# Patient Record
Sex: Male | Born: 1950 | Race: White | Hispanic: No | Marital: Single | Smoking: Never smoker
Health system: Southern US, Community
[De-identification: ages and names within clinical notes are randomized; demographics above are authoritative.]

## PROBLEM LIST (undated history)

## (undated) DIAGNOSIS — N200 Calculus of kidney: Secondary | ICD-10-CM

## (undated) DIAGNOSIS — D649 Anemia, unspecified: Secondary | ICD-10-CM

## (undated) DIAGNOSIS — E785 Hyperlipidemia, unspecified: Secondary | ICD-10-CM

## (undated) DIAGNOSIS — E559 Vitamin D deficiency, unspecified: Secondary | ICD-10-CM

## (undated) DIAGNOSIS — D126 Benign neoplasm of colon, unspecified: Secondary | ICD-10-CM

## (undated) DIAGNOSIS — I10 Essential (primary) hypertension: Secondary | ICD-10-CM

## (undated) HISTORY — DX: Vitamin D deficiency, unspecified: E55.9

## (undated) HISTORY — DX: Calculus of kidney: N20.0

## (undated) HISTORY — DX: Anemia, unspecified: D64.9

## (undated) HISTORY — PX: COLONOSCOPY W/ POLYPECTOMY: SHX1380

## (undated) HISTORY — PX: POLYPECTOMY: SHX149

## (undated) HISTORY — PX: COLONOSCOPY: SHX174

## (undated) HISTORY — PX: OTHER SURGICAL HISTORY: SHX169

## (undated) HISTORY — PX: TONSILLECTOMY: SHX5217

## (undated) HISTORY — DX: Benign neoplasm of colon, unspecified: D12.6

---

## 1997-07-04 ENCOUNTER — Ambulatory Visit (HOSPITAL_COMMUNITY): Admission: RE | Admit: 1997-07-04 | Discharge: 1997-07-04 | Payer: Self-pay | Admitting: Family Medicine

## 2000-01-20 ENCOUNTER — Emergency Department (HOSPITAL_COMMUNITY): Admission: EM | Admit: 2000-01-20 | Discharge: 2000-01-20 | Payer: Self-pay | Admitting: Emergency Medicine

## 2008-10-03 ENCOUNTER — Ambulatory Visit: Payer: Self-pay | Admitting: Gastroenterology

## 2008-10-22 ENCOUNTER — Encounter: Payer: Self-pay | Admitting: Gastroenterology

## 2008-10-22 ENCOUNTER — Telehealth: Payer: Self-pay | Admitting: Gastroenterology

## 2008-10-24 ENCOUNTER — Ambulatory Visit: Payer: Self-pay | Admitting: Gastroenterology

## 2008-10-24 ENCOUNTER — Encounter: Payer: Self-pay | Admitting: Gastroenterology

## 2008-10-25 ENCOUNTER — Encounter: Payer: Self-pay | Admitting: Gastroenterology

## 2011-07-26 ENCOUNTER — Encounter (HOSPITAL_COMMUNITY): Payer: Self-pay | Admitting: *Deleted

## 2011-07-26 ENCOUNTER — Emergency Department (HOSPITAL_COMMUNITY)
Admission: EM | Admit: 2011-07-26 | Discharge: 2011-07-26 | Disposition: A | Discharge status: Home / Self Care | Payer: PRIVATE HEALTH INSURANCE | Attending: Emergency Medicine | Admitting: Emergency Medicine

## 2011-07-26 ENCOUNTER — Emergency Department (HOSPITAL_COMMUNITY): Payer: PRIVATE HEALTH INSURANCE

## 2011-07-26 DIAGNOSIS — R112 Nausea with vomiting, unspecified: Secondary | ICD-10-CM | POA: Insufficient documentation

## 2011-07-26 DIAGNOSIS — R109 Unspecified abdominal pain: Secondary | ICD-10-CM | POA: Insufficient documentation

## 2011-07-26 DIAGNOSIS — I10 Essential (primary) hypertension: Secondary | ICD-10-CM | POA: Insufficient documentation

## 2011-07-26 DIAGNOSIS — E785 Hyperlipidemia, unspecified: Secondary | ICD-10-CM | POA: Insufficient documentation

## 2011-07-26 DIAGNOSIS — Z79899 Other long term (current) drug therapy: Secondary | ICD-10-CM | POA: Insufficient documentation

## 2011-07-26 DIAGNOSIS — Z7982 Long term (current) use of aspirin: Secondary | ICD-10-CM | POA: Insufficient documentation

## 2011-07-26 DIAGNOSIS — N201 Calculus of ureter: Secondary | ICD-10-CM | POA: Insufficient documentation

## 2011-07-26 HISTORY — DX: Hyperlipidemia, unspecified: E78.5

## 2011-07-26 HISTORY — DX: Essential (primary) hypertension: I10

## 2011-07-26 LAB — URINALYSIS, ROUTINE W REFLEX MICROSCOPIC
Nitrite: NEGATIVE
Specific Gravity, Urine: 1.025 (ref 1.005–1.030)
Urobilinogen, UA: 0.2 mg/dL (ref 0.0–1.0)

## 2011-07-26 LAB — COMPREHENSIVE METABOLIC PANEL
AST: 22 U/L (ref 0–37)
Albumin: 4.1 g/dL (ref 3.5–5.2)
BUN: 20 mg/dL (ref 6–23)
Chloride: 107 mEq/L (ref 96–112)
Creatinine, Ser: 1.35 mg/dL (ref 0.50–1.35)
Potassium: 3.5 mEq/L (ref 3.5–5.1)
Total Bilirubin: 0.6 mg/dL (ref 0.3–1.2)
Total Protein: 7 g/dL (ref 6.0–8.3)

## 2011-07-26 LAB — CBC
MCHC: 33.6 g/dL (ref 30.0–36.0)
MCV: 88.3 fL (ref 78.0–100.0)
Platelets: 155 10*3/uL (ref 150–400)
RDW: 13.5 % (ref 11.5–15.5)
WBC: 8.8 10*3/uL (ref 4.0–10.5)

## 2011-07-26 LAB — URINE MICROSCOPIC-ADD ON

## 2011-07-26 LAB — LIPASE, BLOOD: Lipase: 21 U/L (ref 11–59)

## 2011-07-26 MED ORDER — MORPHINE SULFATE 4 MG/ML IJ SOLN
4.0000 mg | Freq: Once | INTRAMUSCULAR | Status: AC
  Administered 2011-07-26: 4 mg via INTRAVENOUS
  Filled 2011-07-26: qty 1

## 2011-07-26 MED ORDER — OXYCODONE-ACETAMINOPHEN 5-325 MG PO TABS: 2.0000 | ORAL_TABLET | ORAL | Status: AC | PRN

## 2011-07-26 MED ORDER — IOHEXOL 300 MG/ML  SOLN
100.0000 mL | Freq: Once | INTRAMUSCULAR | Status: AC | PRN
  Administered 2011-07-26: 100 mL via INTRAVENOUS

## 2011-07-26 MED ORDER — ONDANSETRON HCL 4 MG/2ML IJ SOLN
4.0000 mg | Freq: Once | INTRAMUSCULAR | Status: AC
  Administered 2011-07-26: 4 mg via INTRAVENOUS
  Filled 2011-07-26: qty 2

## 2011-07-26 MED ORDER — SODIUM CHLORIDE 0.9 % IV SOLN
INTRAVENOUS | Status: DC
  Administered 2011-07-26: 05:00:00 via INTRAVENOUS

## 2011-07-26 MED ORDER — PROMETHAZINE HCL 25 MG PO TABS: 25.0000 mg | ORAL_TABLET | Freq: Four times a day (QID) | ORAL | Status: DC | PRN

## 2011-07-26 MED ORDER — HYDROMORPHONE HCL PF 1 MG/ML IJ SOLN
1.0000 mg | Freq: Once | INTRAMUSCULAR | Status: AC
  Administered 2011-07-26: 1 mg via INTRAVENOUS
  Filled 2011-07-26: qty 1

## 2011-07-26 NOTE — ED Provider Notes (Signed)
History     CSN: [id number]  Arrival date & time 07/26/11  0422   First MD Initiated Contact with Patient 07/26/11 [phone]      Chief Complaint  Patient presents with  . Abdominal Pain  . Nausea  . Emesis    (Consider location/radiation/quality/duration/timing/severity/associated sxs/prior treatment) HPI History provided by the patient. Left-sided abdominal pain onset last night. Has associated nausea vomiting. No fevers. No hematuria. No trauma. No radiation of pain. Quality dull. No history of similar symptoms. No known aggravating or alleviating factors.  Past Medical History  Diagnosis Date  . Hypertension   . Hyperlipidemia     No past surgical history on file.  History reviewed. No pertinent family history.  History  Substance Use Topics  . Smoking status: Not on file  . Smokeless tobacco: Not on file  . Alcohol Use: No      Review of Systems  Constitutional: Negative for fever and chills.  HENT: Negative for neck pain and neck stiffness.   Eyes: Negative for pain.  Respiratory: Negative for shortness of breath.   Cardiovascular: Negative for chest pain.  Gastrointestinal: Positive for nausea, vomiting and abdominal pain.  Genitourinary: Positive for flank pain. Negative for dysuria and hematuria.  Musculoskeletal: Negative for back pain.  Skin: Negative for rash.  Neurological: Negative for headaches.  All other systems reviewed and are negative.    Allergies  Review of patient's allergies indicates no known allergies.  Home Medications   Current Outpatient Rx  Name Route Sig Dispense Refill  . AMLODIPINE BESYLATE 10 MG PO TABS Oral Take 10 mg by mouth daily.    . ASPIRIN 81 MG PO CHEW Oral Chew 81 mg by mouth daily.    Marland Kitchen LISINOPRIL 20 MG PO TABS Oral Take 20 mg by mouth daily.    Marland Kitchen SIMVASTATIN 40 MG PO TABS Oral Take 40 mg by mouth every evening.    Marland Kitchen VITAMIN B-1 100 MG PO TABS Oral Take 100 mg by mouth daily.      BP 157/81  Pulse 78   Temp(Src) 98.3 F (36.8 C) (Oral)  Resp 16  SpO2 95%  Physical Exam  Constitutional: He is oriented to person, place, and time. He appears well-developed and well-nourished.  HENT:  Head: Normocephalic and atraumatic.  Eyes: Conjunctivae and EOM are normal. Pupils are equal, round, and reactive to light.  Neck: Trachea normal. Neck supple. No thyromegaly present.  Cardiovascular: Normal rate, regular rhythm, S1 normal, S2 normal and normal pulses.     No systolic murmur is present   No diastolic murmur is present  Pulses:      Radial pulses are 2+ on the right side, and 2+ on the left side.  Pulmonary/Chest: Effort normal and breath sounds normal. He has no wheezes. He has no rhonchi. He has no rales. He exhibits no tenderness.  Abdominal: Soft. Normal appearance and bowel sounds are normal. There is no rebound, no guarding, no CVA tenderness and negative Murphy's sign.       Tender left lower quadrant  Musculoskeletal:       BLE:s Calves nontender, no cords or erythema, negative Homans sign  Neurological: He is alert and oriented to person, place, and time. He has normal strength. No cranial nerve deficit or sensory deficit. GCS eye subscore is 4. GCS verbal subscore is 5. GCS motor subscore is 6.  Skin: Skin is warm and dry. No rash noted. He is not diaphoretic.  Psychiatric: His speech is normal.  Cooperative and appropriate    ED Course  Procedures (including critical care time)  Labs Reviewed  COMPREHENSIVE METABOLIC PANEL - Abnormal; Notable for the following:    Glucose, Bld 130 (*)    GFR calc non Af Amer 55 (*)    GFR calc Af Amer 64 (*)    All other components within normal limits  URINALYSIS, ROUTINE W REFLEX MICROSCOPIC - Abnormal; Notable for the following:    Hgb urine dipstick SMALL (*)    Ketones, ur 40 (*)    All other components within normal limits  CBC  LIPASE, BLOOD  URINE MICROSCOPIC-ADD ON   Ct Abdomen Pelvis W Contrast  07/26/2011  *RADIOLOGY  REPORT*  Clinical Data: Left lower quadrant abdominal pain with nausea and vomiting.  CT ABDOMEN AND PELVIS WITH CONTRAST  Technique:  Multidetector CT imaging of the abdomen and pelvis was performed following the standard protocol during bolus administration of intravenous contrast.  Contrast: OMNIPAQUE IOHEXOL 300 MG/ML  SOLN  Comparison: None.  Findings: There is a 4 mm calculus located either at the left ureterovesicle junction or just into the lumen of the bladder. Associated left-sided hydroureter and hydronephrosis present as well as evidence of forniceal rupture with perinephric fluid present.  Additional calculus in the mid collecting system of the left kidney measures 10 mm in height.  No right-sided renal calculi or obstruction.  The liver, gallbladder, pancreas, spleen, adrenal glands and bowel are within normal limits.  No hernias.  No abscess.  The prostate gland shows moderate enlargement.  No enlarged lymph nodes or incidental masses.  Bony structures show mild degenerative changes of the spine.  IMPRESSION: Left-sided renal obstruction with hydronephrosis, hydroureter and evidence of forniceal decompression due to a 4 mm calculus located either at the left UVJ or just into the lumen of the bladder. There is an additional 10 mm calculus in the mid left kidney.  Original Report Authenticated By: Reola Calkins, M.D.    IV fluids. Pain medications. Labs. UA. CAT scan as above  MDM   Left-sided ureterolithiasis. Symptomatically improved in the ED. Urology referral provided. Prescription for pain medications and nausea medications. Reliable historian verbalizes understanding kidney stone precautions and is stable for discharge        Sunnie Nielsen, MD 07/26/11 [phone]

## 2011-07-26 NOTE — Discharge Instructions (Signed)
Kidney Stones Kidney stones (ureteral lithiasis) are deposits that form inside your kidneys. The intense pain is caused by the stone moving through the urinary tract. When the stone moves, the ureter goes into spasm around the stone. The stone is usually passed in the urine.  CAUSES   A disorder that makes certain neck glands produce too much parathyroid hormone (primary hyperparathyroidism).   A buildup of uric acid crystals.   Narrowing (stricture) of the ureter.   A kidney obstruction present at birth (congenital obstruction).   Previous surgery on the kidney or ureters.   Numerous kidney infections.  SYMPTOMS   Feeling sick to your stomach (nauseous).   Throwing up (vomiting).   Blood in the urine (hematuria).   Pain that usually spreads (radiates) to the groin.   Frequency or urgency of urination.  DIAGNOSIS   Taking a history and physical exam.   Blood or urine tests.   Computerized X-ray scan (CT scan).   Occasionally, an examination of the inside of the urinary bladder (cystoscopy) is performed.  TREATMENT   Observation.   Increasing your fluid intake.   Surgery may be needed if you have severe pain or persistent obstruction.  The size, location, and chemical composition are all important variables that will determine the proper choice of action for you. Talk to your caregiver to better understand your situation so that you will minimize the risk of injury to yourself and your kidney.  HOME CARE INSTRUCTIONS   Drink enough water and fluids to keep your urine clear or pale yellow.   Strain all urine through the provided strainer. Keep all particulate matter and stones for your caregiver to see. The stone causing the pain may be as small as a grain of salt. It is very important to use the strainer each and every time you pass your urine. The collection of your stone will allow your caregiver to analyze it and verify that a stone has actually passed.   Only take  over-the-counter or prescription medicines for pain, discomfort, or fever as directed by your caregiver.   Make a follow-up appointment with your caregiver as directed.   Get follow-up X-rays if required. The absence of pain does not always mean that the stone has passed. It may have only stopped moving. If the urine remains completely obstructed, it can cause loss of kidney function or even complete destruction of the kidney. It is your responsibility to make sure X-rays and follow-ups are completed. Ultrasounds of the kidney can show blockages and the status of the kidney. Ultrasounds are not associated with any radiation and can be performed easily in a matter of minutes.  SEEK IMMEDIATE MEDICAL CARE IF:   Pain cannot be controlled with the prescribed medicine.   You have a fever.   The severity or intensity of pain increases over 18 hours and is not relieved by pain medicine.   You develop a new onset of abdominal pain.   You feel faint or pass out.  MAKE SURE YOU:   Understand these instructions.   Will watch your condition.

## 2012-08-23 ENCOUNTER — Other Ambulatory Visit (INDEPENDENT_AMBULATORY_CARE_PROVIDER_SITE_OTHER): Payer: BC Managed Care – PPO

## 2012-08-23 DIAGNOSIS — E785 Hyperlipidemia, unspecified: Secondary | ICD-10-CM

## 2012-08-23 DIAGNOSIS — I10 Essential (primary) hypertension: Secondary | ICD-10-CM

## 2012-08-23 DIAGNOSIS — E559 Vitamin D deficiency, unspecified: Secondary | ICD-10-CM

## 2012-08-23 DIAGNOSIS — Z125 Encounter for screening for malignant neoplasm of prostate: Secondary | ICD-10-CM

## 2012-08-23 LAB — COMPREHENSIVE METABOLIC PANEL
ALT: 16 U/L (ref 0–53)
AST: 22 U/L (ref 0–37)
Albumin: 3.9 g/dL (ref 3.5–5.2)
Alkaline Phosphatase: 71 U/L (ref 39–117)
BUN: 15 mg/dL (ref 6–23)
CO2: 26 mEq/L (ref 19–32)
Calcium: 9.1 mg/dL (ref 8.4–10.5)
Chloride: 108 mEq/L (ref 96–112)
Creat: 0.98 mg/dL (ref 0.50–1.35)
Glucose, Bld: 84 mg/dL (ref 70–99)
Potassium: 3.8 mEq/L (ref 3.5–5.3)
Sodium: 144 mEq/L (ref 135–145)
Total Bilirubin: 0.9 mg/dL (ref 0.3–1.2)
Total Protein: 6.2 g/dL (ref 6.0–8.3)

## 2012-08-23 LAB — PSA: PSA: 0.74 ng/mL (ref <=4.00)

## 2012-08-23 NOTE — Progress Notes (Signed)
Patient came in for labs only.

## 2012-08-24 LAB — VITAMIN D 25 HYDROXY (VIT D DEFICIENCY, FRACTURES): Vit D, 25-Hydroxy: 41 ng/mL (ref 30–89)

## 2012-08-25 LAB — NMR LIPOPROFILE WITH LIPIDS
Cholesterol, Total: 159 mg/dL (ref <200)
HDL Particle Number: 36.1 umol/L (ref >=30.5)
HDL Size: 9.5 nm (ref >=9.2)
HDL-C: 60 mg/dL (ref >=40)
LDL (calc): 84 mg/dL (ref <100)
LDL Particle Number: 955 nmol/L (ref <1000)
LDL Size: 21.3 nm (ref >20.5)
LP-IR Score: <25 (ref <=45)
Large HDL-P: 11.6 umol/L (ref >=4.8)
Large VLDL-P: 1.1 nmol/L (ref <=2.7)
Small LDL Particle Number: 319 nmol/L (ref <=527)
Triglycerides: 75 mg/dL (ref <150)
VLDL Size: 38.3 nm (ref <=46.6)

## 2012-08-26 ENCOUNTER — Encounter: Payer: Self-pay | Admitting: *Deleted

## 2012-08-28 NOTE — Progress Notes (Signed)
Quick Note:  Lab result at goal. No change in Medications for now. No Change in plans and follow up. ______ 

## 2012-09-01 ENCOUNTER — Encounter: Payer: Self-pay | Admitting: Family Medicine

## 2012-09-01 ENCOUNTER — Ambulatory Visit (INDEPENDENT_AMBULATORY_CARE_PROVIDER_SITE_OTHER): Payer: BC Managed Care – PPO

## 2012-09-01 ENCOUNTER — Ambulatory Visit (INDEPENDENT_AMBULATORY_CARE_PROVIDER_SITE_OTHER): Payer: BC Managed Care – PPO | Admitting: Family Medicine

## 2012-09-01 VITALS — BP 125/80 | HR 66 | Temp 97.6°F | Ht 70.0 in | Wt 189.8 lb

## 2012-09-01 DIAGNOSIS — I10 Essential (primary) hypertension: Secondary | ICD-10-CM | POA: Insufficient documentation

## 2012-09-01 DIAGNOSIS — R062 Wheezing: Secondary | ICD-10-CM | POA: Insufficient documentation

## 2012-09-01 DIAGNOSIS — R059 Cough, unspecified: Secondary | ICD-10-CM

## 2012-09-01 DIAGNOSIS — N200 Calculus of kidney: Secondary | ICD-10-CM | POA: Insufficient documentation

## 2012-09-01 DIAGNOSIS — J40 Bronchitis, not specified as acute or chronic: Secondary | ICD-10-CM | POA: Insufficient documentation

## 2012-09-01 DIAGNOSIS — R05 Cough: Secondary | ICD-10-CM

## 2012-09-01 DIAGNOSIS — E785 Hyperlipidemia, unspecified: Secondary | ICD-10-CM | POA: Insufficient documentation

## 2012-09-01 MED ORDER — ALBUTEROL SULFATE HFA 108 (90 BASE) MCG/ACT IN AERS: 2.0000 | INHALATION_SPRAY | Freq: Four times a day (QID) | RESPIRATORY_TRACT | Status: DC | PRN

## 2012-09-01 MED ORDER — LISINOPRIL 20 MG PO TABS: 20.0000 mg | ORAL_TABLET | Freq: Every day | ORAL | Status: DC

## 2012-09-01 MED ORDER — SIMVASTATIN 20 MG PO TABS: 10.0000 mg | ORAL_TABLET | Freq: Every evening | ORAL | Status: DC

## 2012-09-01 MED ORDER — AMLODIPINE BESYLATE 10 MG PO TABS: 10.0000 mg | ORAL_TABLET | Freq: Every day | ORAL | Status: DC

## 2012-09-01 MED ORDER — AZITHROMYCIN 250 MG PO TABS: ORAL_TABLET | ORAL | Status: DC

## 2012-09-01 NOTE — Patient Instructions (Signed)
      Dr Rainier Feuerborn's Recommendations  Diet and Exercise discussed with patient.  For nutrition information, I recommend books:  1).Eat to Live by Dr Joel Fuhrman. 2).Prevent and Reverse Heart Disease by Dr Caldwell Esselstyn. 3) Dr Neal Barnard's Book: Reversing Diabetes  Exercise recommendations are:  If unable to walk, then the patient can exercise in a chair 3 times a day. By flapping arms like a bird gently and raising legs outwards to the front.  If ambulatory, the patient can go for walks for 30 minutes 3 times a week. Then increase the intensity and duration as tolerated.  Goal is to try to attain exercise frequency to 5 times a week.  If applicable: Best to perform resistance exercises (machines or weights) 2 days a week and cardio type exercises 3 days per week.  

## 2012-09-01 NOTE — Progress Notes (Signed)
Patient ID: Jeffrey Downs, male   DOB: 1950/09/23, 62 y.o.   MRN: [id number] SUBJECTIVE: Chief Complaint  Patient presents with  . Follow-up    congestion stuffy nose   . Medication Refill    90 day supply needed on simvastatin lisonopril amlodipine     HPI: 1) coughing up phlegm. Wheezing at nights. No fever, no chills  2)Patient is here for follow up of hyperlipidemia/hypertension:  denies Headache;denies Chest Pain;denies weakness;denies Shortness of Breath and orthopnea;denies Visual changes;denies palpitations;denies cough;denies pedal edema;denies symptoms of TIA or stroke;deniesClaudication symptoms. admits to Compliance with medications; denies Problems with medications.  3) h/o nephrolithiasis: symptom free for > 1 year   PMH/PSH: reviewed/updated in Epic  SH/FH: reviewed/updated in Epic. Retired from a [employer] company  Allergies: reviewed/updated in [employer]  Medications: reviewed/updated in [employer]  Immunizations: reviewed/updated in Epic  ROS: As above in the HPI. All other systems are stable or negative.  OBJECTIVE: APPEARANCE:  Patient in no acute distress.The patient appeared well nourished and normally developed. Acyanotic. Waist: VITAL SIGNS:BP 125/80  Pulse 66  Temp(Src) 97.6 F (36.4 C) (Oral)  Ht 5\' 10"  (1.778 m)  Wt 189 lb 12.8 oz (86.093 kg)  BMI 27.23 kg/m2 WM NAD.  SKIN: warm and  Dry without overt rashes, tattoos and scars  HEAD and Neck: without JVD, Head and scalp: normal Eyes:No scleral icterus. Fundi normal, eye movements normal. Ears: Auricle normal, canal normal, Tympanic membranes normal, insufflation normal. Nose: normal Throat: normal Neck & thyroid: normal  CHEST & LUNGS: Chest wall: normal Lungs: wheezes in the bibasilar bases. Few crackles in bases. No rhonchi.  CVS: Reveals the PMI to be normally located. Regular rhythm, First and Second Heart sounds are normal,  absence of murmurs, rubs or gallops. Peripheral  vasculature: Radial pulses: normal Dorsal pedis pulses: normal Posterior pulses: normal  ABDOMEN:  Appearance: normal Benign, no organomegaly, no masses, no Abdominal Aortic enlargement. No Guarding , no rebound. No Bruits. Bowel sounds: normal  RECTAL: N/A GU: N/A  EXTREMETIES: nonedematous. Both Femoral and Pedal pulses are normal.  MUSCULOSKELETAL:  Spine: normal Joints: intact  NEUROLOGIC: oriented to time,place and person; nonfocal. Strength is normal Sensory is normal Reflexes are normal Cranial Nerves are normal.  Results for orders placed in visit on 08/23/12  COMPREHENSIVE METABOLIC PANEL      Result Value Range   Sodium 144  135 - 145 mEq/L   Potassium 3.8  3.5 - 5.3 mEq/L   Chloride 108  96 - 112 mEq/L   CO2 26  19 - 32 mEq/L   Glucose, Bld 84  70 - 99 mg/dL   BUN 15  6 - 23 mg/dL   Creat 4.09  8.11 - 9.14 mg/dL   Total Bilirubin 0.9  0.3 - 1.2 mg/dL   Alkaline Phosphatase 71  39 - 117 U/L   AST 22  0 - 37 U/L   ALT 16  0 - 53 U/L   Total Protein 6.2  6.0 - 8.3 g/dL   Albumin 3.9  3.5 - 5.2 g/dL   Calcium 9.1  8.4 - 78.2 mg/dL  NMR LIPOPROFILE WITH LIPIDS      Result Value Range   LDL Particle Number 955  <1000 nmol/L   LDL (calc) 84  <100 mg/dL   HDL-C 60  >=95 mg/dL   Triglycerides 75  <621 mg/dL   Cholesterol, Total 308  <200 mg/dL   HDL Particle Number 65.7  >=84.6 umol/L   Large HDL-P  11.6  >=4.8 umol/L   Large VLDL-P 1.1  <=2.7 nmol/L   Small LDL Particle Number 319  <=527 nmol/L   LDL Size 21.3  >20.5 nm   HDL Size 9.5  >=9.2 nm   VLDL Size 38.3  <=46.6 nm   LP-IR Score <25  <=45  VITAMIN D 25 HYDROXY      Result Value Range   Vit D, 25-Hydroxy 41  30 - 89 ng/mL  PSA      Result Value Range   PSA 0.74  <=4.00 ng/mL    ASSESSMENT: HTN (hypertension) - Plan: amLODipine (NORVASC) 10 MG tablet, lisinopril (PRINIVIL,ZESTRIL) 20 MG tablet  HLD (hyperlipidemia) - Plan: simvastatin (ZOCOR) 20 MG tablet  Nephrolithiasis  Bronchitis -  Plan: azithromycin (ZITHROMAX Z-PAK) 250 MG tablet, albuterol (PROVENTIL HFA;VENTOLIN HFA) 108 (90 BASE) MCG/ACT inhaler  Cough - Plan: DG Chest 2 View  Wheezing  Discussed his lab results and wellness and healthy lifestyle.  PLAN: [facility] reading (PRIMARY) by  Dr.Andriea Hasegawa  : no acute findings on Chest Xray                        Dr Woodroe Mode Recommendations  Diet and Exercise discussed with patient.  For nutrition information, I recommend books:  1).Eat to Live by Dr Monico Hoar. 2).Prevent and Reverse Heart Disease by Dr Suzzette Righter. 3) Dr Katherina Right Book: Reversing Diabetes  Exercise recommendations are:  If unable to walk, then the patient can exercise in a chair 3 times a day. By flapping arms like a bird gently and raising legs outwards to the front.  If ambulatory, the patient can go for walks for 30 minutes 3 times a week. Then increase the intensity and duration as tolerated.  Goal is to try to attain exercise frequency to 5 times a week.  If applicable: Best to perform resistance exercises (machines or weights) 2 days a week and cardio type exercises 3 days per week.  Orders Placed This Encounter  Procedures  . DG Chest 2 View    Standing Status: Future     Number of Occurrences: 1     Standing Expiration Date: 11/01/2013    Order Specific Question:  Reason for Exam (SYMPTOM  OR DIAGNOSIS REQUIRED)    Answer:  chest congestion, cough    Order Specific Question:  Preferred imaging location?    Answer:  Internal   Meds ordered this encounter  Medications  . Cholecalciferol (VITAMIN D3) 2000 UNITS TABS    Sig: Take 1 capsule by mouth daily.  Marland Kitchen amLODipine (NORVASC) 10 MG tablet    Sig: Take 1 tablet (10 mg total) by mouth daily.    Dispense:  90 tablet    Refill:  1  . lisinopril (PRINIVIL,ZESTRIL) 20 MG tablet    Sig: Take 1 tablet (20 mg total) by mouth daily.    Dispense:  90 tablet    Refill:  1  . simvastatin (ZOCOR) 20 MG tablet    Sig: Take  0.5 tablets (10 mg total) by mouth every evening.    Dispense:  90 tablet    Refill:  1  . azithromycin (ZITHROMAX Z-PAK) 250 MG tablet    Sig: Use as directed    Dispense:  6 each    Refill:  0  . albuterol (PROVENTIL HFA;VENTOLIN HFA) 108 (90 BASE) MCG/ACT inhaler    Sig: Inhale 2 puffs into the lungs every 6 (six) hours as needed for wheezing.  Dispense:  1 Inhaler    Refill:  0   If cough does not resolve RTC for  Re-evaluation before the next appointment.  Return in about 4 months (around 01/01/2013) for Recheck medical problems.  Maika Kaczmarek P. Modesto Charon, M.D.

## 2012-10-20 ENCOUNTER — Telehealth: Payer: Self-pay | Admitting: Family Medicine

## 2012-10-20 ENCOUNTER — Encounter: Payer: Self-pay | Admitting: Family Medicine

## 2012-10-20 ENCOUNTER — Ambulatory Visit (INDEPENDENT_AMBULATORY_CARE_PROVIDER_SITE_OTHER): Payer: BC Managed Care – PPO | Admitting: Family Medicine

## 2012-10-20 VITALS — BP 123/70 | HR 103 | Temp 101.9°F | Ht 70.0 in | Wt 188.0 lb

## 2012-10-20 DIAGNOSIS — R509 Fever, unspecified: Secondary | ICD-10-CM

## 2012-10-20 DIAGNOSIS — R6889 Other general symptoms and signs: Secondary | ICD-10-CM | POA: Insufficient documentation

## 2012-10-20 LAB — POCT INFLUENZA A/B: Influenza B, POC: NEGATIVE

## 2012-10-20 NOTE — Progress Notes (Signed)
  Subjective:    Patient ID: Jeffrey Downs, male    DOB: Mar 16, 1951, 62 y.o.   MRN: [id number]  Fever  Associated symptoms include coughing and headaches.  Cough Associated symptoms include a fever and headaches.  Headache  Associated symptoms include coughing and a fever.    Aches and pains. Started Monday night. Fever started Tuesday night to 102.2. Associated w/ HA, and an occasional wheeze that is relieved by albuterol. Denies any dysuria, sore throat, rhinorrhea, cough, n/v/d/c, rash, CP, SOB, SZR. Denies sick contacts   Review of Systems  Constitutional: Positive for fever.  Respiratory: Positive for cough.   Neurological: Positive for headaches.  All other systems reviewed and are negative.       Objective:   Physical Exam  Constitutional: He appears well-developed and well-nourished. No distress.  HENT:  Head: Normocephalic and atraumatic.  Eyes: EOM are normal. Pupils are equal, round, and reactive to light. Right eye exhibits no discharge. Left eye exhibits no discharge. No scleral icterus.  Neck: Normal range of motion. Neck supple.  Skin: He is not diaphoretic.    BP 123/70  Pulse 103  Temp(Src) 101.9 F (38.8 C) (Oral)  Ht 5\' 10"  (1.778 m)  Wt 188 lb (85.276 kg)  BMI 26.98 kg/m2  SpO2 94%       Assessment & Plan:  62yo Male w/ likely viral infection causing flu like symptoms. Anticipate resolution in 3-5 more days.  Flu negative - conservative mgt at this time. NSAIDs and tylenol for relief, rest and hydration - precautions given - all questions answered.   Shelly Flatten, MD Family Medicine PGY-3 10/20/2012, 10:37 AM

## 2012-10-20 NOTE — Patient Instructions (Signed)
You have a flu-like illness You need to rest and use tylenol and ibuprofen intermittently for symptom relief This should resolve within another 3-5 days at most If your symptoms worsen come back as you may have developed another or secondary infection  Viral Infections A viral infection can be caused by different types of viruses.Most viral infections are not serious and resolve on their own. However, some infections may cause severe symptoms and may lead to further complications. SYMPTOMS Viruses can frequently cause:  Minor sore throat.  Aches and pains.  Headaches.  Runny nose.  Different types of rashes.  Watery eyes.  Tiredness.  Cough.  Loss of appetite.  Gastrointestinal infections, resulting in nausea, vomiting, and diarrhea. These symptoms do not respond to antibiotics because the infection is not caused by bacteria. However, you might catch a bacterial infection following the viral infection. This is sometimes called a "superinfection." Symptoms of such a bacterial infection may include:  Worsening sore throat with pus and difficulty swallowing.  Swollen neck glands.  Chills and a high or persistent fever.  Severe headache.  Tenderness over the sinuses.  Persistent overall ill feeling (malaise), muscle aches, and tiredness (fatigue).  Persistent cough.  Yellow, green, or brown mucus production with coughing. HOME CARE INSTRUCTIONS   Only take over-the-counter or prescription medicines for pain, discomfort, diarrhea, or fever as directed by your caregiver.  Drink enough water and fluids to keep your urine clear or pale yellow. Sports drinks can provide valuable electrolytes, sugars, and hydration.  Get plenty of rest and maintain proper nutrition. Soups and broths with crackers or rice are fine. SEEK IMMEDIATE MEDICAL CARE IF:   You have severe headaches, shortness of breath, chest pain, neck pain, or an unusual rash.  You have uncontrolled vomiting,  diarrhea, or you are unable to keep down fluids.  You or your child has an oral temperature above 102 F (38.9 C), not controlled by medicine.  Your baby is older than 3 months with a rectal temperature of 102 F (38.9 C) or higher.  Your baby is 38 months old or younger with a rectal temperature of 100.4 F (38 C) or higher. MAKE SURE YOU:   Understand these instructions.  Will watch your condition.  Will get help right away if you are not doing well or get worse. Document Released: 12/24/2004 Document Revised: 06/08/2011 Document Reviewed: 07/21/2010 [facility] Patient Information 2014 [place], [place].

## 2012-10-20 NOTE — Telephone Encounter (Signed)
ACTION MEDICAL NOTIFIED AND WE HAVE NOT RECEIVED ANY INFORMATION ON [employer]  LEFT FAX NUMBER TO FAX  AND NUMBER TO RETURN CALL VOICE NAME ON MACHINE WAS MALEA

## 2012-10-20 NOTE — Assessment & Plan Note (Signed)
flui like symptoms.  Conservative mgt at this time NSAIDs and tylenol for symptom relief Rest and hydration recommended No evidence of pulmonary infection or UTI. Precautions reviewed Handout given

## 2013-01-05 ENCOUNTER — Encounter: Payer: Self-pay | Admitting: Family Medicine

## 2013-01-05 ENCOUNTER — Encounter (INDEPENDENT_AMBULATORY_CARE_PROVIDER_SITE_OTHER): Payer: Self-pay

## 2013-01-05 ENCOUNTER — Ambulatory Visit (INDEPENDENT_AMBULATORY_CARE_PROVIDER_SITE_OTHER): Payer: BC Managed Care – PPO | Admitting: Family Medicine

## 2013-01-05 VITALS — BP 135/75 | HR 58 | Temp 97.3°F | Ht 70.0 in | Wt 187.2 lb

## 2013-01-05 DIAGNOSIS — E559 Vitamin D deficiency, unspecified: Secondary | ICD-10-CM

## 2013-01-05 DIAGNOSIS — N200 Calculus of kidney: Secondary | ICD-10-CM

## 2013-01-05 DIAGNOSIS — I10 Essential (primary) hypertension: Secondary | ICD-10-CM

## 2013-01-05 DIAGNOSIS — E785 Hyperlipidemia, unspecified: Secondary | ICD-10-CM

## 2013-01-05 NOTE — Progress Notes (Signed)
Patient ID: Jeffrey Downs, male   DOB: Apr 29, 1950, 62 y.o.   MRN: [id number] SUBJECTIVE: CC: Chief Complaint  Patient presents with  . Follow-up    4 month follow up chronic problems     HPI: Doing great. No complaints.  Patient is here for follow up of hyperlipidemia: denies Headache;denies Chest Pain;denies weakness;denies Shortness of Breath and orthopnea;denies Visual changes;denies palpitations;denies cough;denies pedal edema;denies symptoms of TIA or stroke;deniesClaudication symptoms. admits to Compliance with medications; denies Problems with medications.  Past Medical History  Diagnosis Date  . Hypertension   . Hyperlipidemia    No past surgical history on file. History   Social History  . Marital Status: Single    Spouse Name: N/A    Number of Children: N/A  . Years of Education: N/A   Occupational History  . office supply dealer     retired   Social History Main Topics  . Smoking status: Never Smoker   . Smokeless tobacco: Never Used  . Alcohol Use: No  . Drug Use: No  . Sexual Activity: Not on file   Other Topics Concern  . Not on file   Social History Narrative   Single   No children   No pets    Family History  Problem Relation Age of Onset  . Cancer Father     lung  . Hyperlipidemia Sister   . Hypertension Sister    Current Outpatient Prescriptions on File Prior to Visit  Medication Sig Dispense Refill  . albuterol (PROVENTIL HFA;VENTOLIN HFA) 108 (90 BASE) MCG/ACT inhaler Inhale 2 puffs into the lungs every 6 (six) hours as needed for wheezing.  1 Inhaler  0  . amLODipine (NORVASC) 10 MG tablet Take 1 tablet (10 mg total) by mouth daily.  90 tablet  1  . aspirin 81 MG chewable tablet Chew 81 mg by mouth daily.      . Cholecalciferol (VITAMIN D3) 2000 UNITS TABS Take 1 capsule by mouth daily.      Marland Kitchen lisinopril (PRINIVIL,ZESTRIL) 20 MG tablet Take 1 tablet (20 mg total) by mouth daily.  90 tablet  1  . simvastatin (ZOCOR) 20 MG tablet Take  0.5 tablets (10 mg total) by mouth every evening.  90 tablet  1   No current facility-administered medications on file prior to visit.   No Known Allergies Immunization History  Administered Date(s) Administered  . Tdap 01/29/2011   Prior to Admission medications   Medication Sig Start Date End Date Taking? Authorizing Provider  albuterol (PROVENTIL HFA;VENTOLIN HFA) 108 (90 BASE) MCG/ACT inhaler Inhale 2 puffs into the lungs every 6 (six) hours as needed for wheezing. 09/01/12  Yes Ileana Ladd, MD  amLODipine (NORVASC) 10 MG tablet Take 1 tablet (10 mg total) by mouth daily. 09/01/12  Yes Ileana Ladd, MD  aspirin 81 MG chewable tablet Chew 81 mg by mouth daily.   Yes Historical Provider, MD  Cholecalciferol (VITAMIN D3) 2000 UNITS TABS Take 1 capsule by mouth daily.   Yes Historical Provider, MD  lisinopril (PRINIVIL,ZESTRIL) 20 MG tablet Take 1 tablet (20 mg total) by mouth daily. 09/01/12  Yes Ileana Ladd, MD  simvastatin (ZOCOR) 20 MG tablet Take 0.5 tablets (10 mg total) by mouth every evening. 09/01/12  Yes Ileana Ladd, MD    ROS: As above in the HPI. All other systems are stable or negative.  OBJECTIVE: APPEARANCE:  Patient in no acute distress.The patient appeared well nourished and normally developed. Acyanotic. Waist:  VITAL SIGNS:BP 135/75  Pulse 58  Temp(Src) 97.3 F (36.3 C) (Oral)  Ht 5\' 10"  (1.778 m)  Wt 187 lb 3.2 oz (84.913 kg)  BMI 26.86 kg/m2 WM  SKIN: warm and  Dry without overt rashes, tattoos and scars  HEAD and Neck: without JVD, Head and scalp: normal Eyes:No scleral icterus. Fundi normal, eye movements normal. Ears: Auricle normal, canal normal, Tympanic membranes normal, insufflation normal. Nose: normal Throat: normal Neck & thyroid: normal  CHEST & LUNGS: Chest wall: normal Lungs: Clear  CVS: Reveals the PMI to be normally located. Regular rhythm, First and Second Heart sounds are normal,  absence of murmurs, rubs or  gallops. Peripheral vasculature: Radial pulses: normal Dorsal pedis pulses: normal Posterior pulses: normal  ABDOMEN:  Appearance: normal Benign, no organomegaly, no masses, no Abdominal Aortic enlargement. No Guarding , no rebound. No Bruits. Bowel sounds: normal  RECTAL: N/A GU: N/A  EXTREMETIES: nonedematous.  MUSCULOSKELETAL:  Spine: normal Joints: intact  NEUROLOGIC: oriented to time,place and person; nonfocal. Strength is normal Sensory is normal Reflexes are normal Cranial Nerves are normal. Results for orders placed in visit on 10/20/12  POCT INFLUENZA A/B      Result Value Range   Influenza A, POC Negative     Influenza B, POC Negative      ASSESSMENT: HLD (hyperlipidemia) - Plan: CMP14+EGFR, Lipid panel  HTN (hypertension) - Plan: CMP14+EGFR  Nephrolithiasis  Vitamin D deficiency - Plan: Vit D  25 hydroxy (rtn osteoporosis monitoring)   PLAN: Orders Placed This Encounter  Procedures  . CMP14+EGFR  . Lipid panel  . Vit D  25 hydroxy (rtn osteoporosis monitoring)   No change in Medication. Diet and exercise and healthy lifestyle reviewed and encouraged. He is doing well.  Return in about 6 months (around 07/06/2013) for Recheck medical problems.  Lashaya Kienitz P. [place]esto Charon, M.D.

## 2013-01-06 LAB — CMP14+EGFR
ALT: 16 IU/L (ref 0–44)
AST: 21 IU/L (ref 0–40)
Albumin/Globulin Ratio: 2 (ref 1.1–2.5)
Albumin: 4.3 g/dL (ref 3.6–4.8)
Alkaline Phosphatase: 85 IU/L (ref 39–117)
BUN/Creatinine Ratio: 14 (ref 10–22)
BUN: 16 mg/dL (ref 8–27)
CO2: 27 mmol/L (ref 18–29)
Calcium: 9.5 mg/dL (ref 8.6–10.2)
Chloride: 104 mmol/L (ref 97–108)
Creatinine, Ser: 1.14 mg/dL (ref 0.76–1.27)
GFR calc Af Amer: 79 mL/min/{1.73_m2} (ref >59)
GFR calc non Af Amer: 68 mL/min/{1.73_m2} (ref >59)
Globulin, Total: 2.2 g/dL (ref 1.5–4.5)
Glucose: 90 mg/dL (ref 65–99)
Potassium: 4.6 mmol/L (ref 3.5–5.2)
Sodium: 144 mmol/L (ref 134–144)
Total Bilirubin: 1 mg/dL (ref 0.0–1.2)
Total Protein: 6.5 g/dL (ref 6.0–8.5)

## 2013-01-06 LAB — LIPID PANEL
Chol/HDL Ratio: 2.4 ratio units (ref 0.0–5.0)
Cholesterol, Total: 193 mg/dL (ref 100–199)
HDL: 79 mg/dL (ref >39)
LDL Calculated: 97 mg/dL (ref 0–99)
Triglycerides: 83 mg/dL (ref 0–149)
VLDL Cholesterol Cal: 17 mg/dL (ref 5–40)

## 2013-01-06 LAB — VITAMIN D 25 HYDROXY (VIT D DEFICIENCY, FRACTURES): Vit D, 25-Hydroxy: 23.2 ng/mL — ABNORMAL LOW (ref 30.0–100.0)

## 2013-03-24 ENCOUNTER — Other Ambulatory Visit: Payer: Self-pay | Admitting: Family Medicine

## 2013-04-17 ENCOUNTER — Encounter: Payer: Self-pay | Admitting: *Deleted

## 2013-07-06 ENCOUNTER — Ambulatory Visit: Payer: BC Managed Care – PPO | Admitting: Family Medicine

## 2013-07-07 ENCOUNTER — Encounter: Payer: Self-pay | Admitting: Family Medicine

## 2013-07-07 ENCOUNTER — Ambulatory Visit (INDEPENDENT_AMBULATORY_CARE_PROVIDER_SITE_OTHER): Payer: 59 | Admitting: Family Medicine

## 2013-07-07 VITALS — BP 131/76 | HR 60 | Temp 97.0°F | Ht 70.0 in | Wt 190.4 lb

## 2013-07-07 DIAGNOSIS — Z23 Encounter for immunization: Secondary | ICD-10-CM

## 2013-07-07 DIAGNOSIS — E559 Vitamin D deficiency, unspecified: Secondary | ICD-10-CM

## 2013-07-07 DIAGNOSIS — N200 Calculus of kidney: Secondary | ICD-10-CM

## 2013-07-07 DIAGNOSIS — E785 Hyperlipidemia, unspecified: Secondary | ICD-10-CM

## 2013-07-07 DIAGNOSIS — I10 Essential (primary) hypertension: Secondary | ICD-10-CM

## 2013-07-07 MED ORDER — LISINOPRIL 20 MG PO TABS: ORAL_TABLET | ORAL | Status: DC

## 2013-07-07 MED ORDER — SIMVASTATIN 20 MG PO TABS: ORAL_TABLET | ORAL | Status: DC

## 2013-07-07 MED ORDER — AMLODIPINE BESYLATE 10 MG PO TABS: ORAL_TABLET | ORAL | Status: DC

## 2013-07-07 NOTE — Progress Notes (Signed)
Patient ID: Jeffrey Downs, male   DOB: 1950-11-14, 63 y.o.   MRN: [id number] SUBJECTIVE: CC: Chief Complaint  Patient presents with  . Follow-up    6 month follow up chronic problelms     RCB:ULAGTXM is here for follow up of hyperlipidemia/HTN/VitD Def: denies Headache;denies Chest Pain;denies weakness;denies Shortness of Breath and orthopnea;denies Visual changes;denies palpitations;denies cough;denies pedal edema;denies symptoms of TIA or stroke;deniesClaudication symptoms. admits to Compliance with medications; denies Problems with medications.  Kidney stones.: no problems no occurence   Past Medical History  Diagnosis Date  . Hypertension   . Hyperlipidemia   . Kidney stones   . Vitamin D deficiency    No past surgical history on file. History   Social History  . Marital Status: Single    Spouse Name: N/A    Number of Children: N/A  . Years of Education: N/A   Occupational History  . office supply dealer     retired   Social History Main Topics  . Smoking status: Never Smoker   . Smokeless tobacco: Never Used  . Alcohol Use: No  . Drug Use: No  . Sexual Activity: Not on file   Other Topics Concern  . Not on file   Social History Narrative   Single   No children   No pets    Family History  Problem Relation Age of Onset  . Cancer Father     lung  . Hyperlipidemia Sister   . Hypertension Sister    Current Outpatient Prescriptions on File Prior to Visit  Medication Sig Dispense Refill  . albuterol (PROVENTIL HFA;VENTOLIN HFA) 108 (90 BASE) MCG/ACT inhaler Inhale 2 puffs into the lungs every 6 (six) hours as needed for wheezing.  1 Inhaler  0  . aspirin 81 MG chewable tablet Chew 81 mg by mouth daily.      . Cholecalciferol (VITAMIN D3) 2000 UNITS TABS Take 1 capsule by mouth daily.       No current facility-administered medications on file prior to visit.   No Known Allergies Immunization History  Administered Date(s) Administered  . Tdap  01/29/2011  . Zoster 07/07/2013   Prior to Admission medications   Medication Sig Start Date End Date Taking? Authorizing Provider  albuterol (PROVENTIL HFA;VENTOLIN HFA) 108 (90 BASE) MCG/ACT inhaler Inhale 2 puffs into the lungs every 6 (six) hours as needed for wheezing. 09/01/12  Yes Vernie Shanks, MD  amLODipine (NORVASC) 10 MG tablet TAKE ONE TABLET BY MOUTH ONCE DAILY 03/24/13  Yes Vernie Shanks, MD  aspirin 81 MG chewable tablet Chew 81 mg by mouth daily.   Yes Historical Provider, MD  Cholecalciferol (VITAMIN D3) 2000 UNITS TABS Take 1 capsule by mouth daily.   Yes Historical Provider, MD  lisinopril (PRINIVIL,ZESTRIL) 20 MG tablet TAKE ONE TABLET BY MOUTH ONCE DAILY 03/24/13  Yes Vernie Shanks, MD  simvastatin (ZOCOR) 20 MG tablet Take 0.5 tablets (10 mg total) by mouth every evening. 09/01/12  Yes Vernie Shanks, MD     ROS: As above in the HPI. All other systems are stable or negative.  OBJECTIVE: APPEARANCE:  Patient in no acute distress.The patient appeared well nourished and normally developed. Acyanotic. Waist: VITAL SIGNS:BP 131/76  Pulse 60  Temp(Src) 97 F (36.1 C) (Oral)  Ht 5' 10"  (1.778 m)  Wt 190 lb 6.4 oz (86.365 kg)  BMI 27.32 kg/m2   SKIN: warm and  Dry without overt rashes, tattoos and scars  HEAD and Neck: without  JVD, Head and scalp: normal Eyes:No scleral icterus. Fundi normal, eye movements normal. Ears: Auricle normal, canal normal, Tympanic membranes normal, insufflation normal. Nose: normal Throat: normal Neck & thyroid: normal  CHEST & LUNGS: Chest wall: normal Lungs: Clear  CVS: Reveals the PMI to be normally located. Regular rhythm, First and Second Heart sounds are normal,  absence of murmurs, rubs or gallops. Peripheral vasculature: Radial pulses: normal Dorsal pedis pulses: normal Posterior pulses: normal  ABDOMEN:  Appearance: normal Benign, no organomegaly, no masses, no Abdominal Aortic enlargement. No Guarding , no  rebound. No Bruits. Bowel sounds: normal  RECTAL: N/A GU: N/A  EXTREMETIES: nonedematous.  MUSCULOSKELETAL:  Spine: normal Joints: intact  NEUROLOGIC: oriented to time,place and person; nonfocal. Strength is normal Sensory is normal Reflexes are normal Cranial Nerves are normal.  ASSESSMENT: Need for zoster vaccination - Plan: Varicella-zoster vaccine subcutaneous  Vitamin D deficiency - Plan: Vit D  25 hydroxy (rtn osteoporosis monitoring)  Nephrolithiasis  HTN (hypertension) - Plan: CMP14+EGFR, amLODipine (NORVASC) 10 MG tablet, lisinopril (PRINIVIL,ZESTRIL) 20 MG tablet  HLD (hyperlipidemia) - Plan: Lipid panel, simvastatin (ZOCOR) 20 MG tablet  PLAN:      Dr Paula Libra Recommendations  For nutrition information, I recommend books:  1).Eat to Live by Dr Excell Seltzer. 2).Prevent and Reverse Heart Disease by Dr Karl Luke. 3) Dr Janene Harvey Book:  Program to Reverse Diabetes  Exercise recommendations are:  If unable to walk, then the patient can exercise in a chair 3 times a day. By flapping arms like a bird gently and raising legs outwards to the front.  If ambulatory, the patient can go for walks for 30 minutes 3 times a week. Then increase the intensity and duration as tolerated.  Goal is to try to attain exercise frequency to 5 times a week.  If applicable: Best to perform resistance exercises (machines or weights) 2 days a week and cardio type exercises 3 days per week.  DASH Diet in the AVS.  Orders Placed This Encounter  Procedures  . Varicella-zoster vaccine subcutaneous  . CMP14+EGFR  . Lipid panel  . Vit D  25 hydroxy (rtn osteoporosis monitoring)   Meds ordered this encounter  Medications  . amLODipine (NORVASC) 10 MG tablet    Sig: TAKE ONE TABLET BY MOUTH ONCE DAILY    Dispense:  90 tablet    Refill:  2  . lisinopril (PRINIVIL,ZESTRIL) 20 MG tablet    Sig: TAKE ONE TABLET BY MOUTH ONCE DAILY    Dispense:  90 tablet     Refill:  2  . simvastatin (ZOCOR) 20 MG tablet    Sig: Use as directed    Dispense:  90 tablet    Refill:  2   Medications Discontinued During This Encounter  Medication Reason  . amLODipine (NORVASC) 10 MG tablet Reorder  . lisinopril (PRINIVIL,ZESTRIL) 20 MG tablet Reorder  . simvastatin (ZOCOR) 20 MG tablet Reorder   Return in about 6 months (around 01/06/2014) for Recheck medical problems.  Harrol Novello P. Jacelyn Grip, M.D.

## 2013-07-07 NOTE — Progress Notes (Signed)
Tolerated zostervax without difficulty

## 2013-07-07 NOTE — Patient Instructions (Addendum)
Herpes Zoster Virus Vaccine What is this medicine? HERPES ZOSTER VIRUS VACCINE (HUR peez ZOS ter vahy ruhs vak SEEN) is a vaccine. It is used to prevent shingles in adults 64 years old and over. This vaccine is not used to treat shingles or nerve pain from shingles. This medicine may be used for other purposes; ask your health care provider or pharmacist if you have questions. COMMON BRAND NAME(S): Zostavax What should I tell my health care provider before I take this medicine? They need to know if you have any of these conditions: -cancer like leukemia or lymphoma -immune system problems or therapy -infection with fever -tuberculosis -an unusual or allergic reaction to vaccines, neomycin, gelatin, other medicines, foods, dyes, or preservatives -pregnant or trying to get pregnant -breast-feeding How should I use this medicine? This vaccine is for injection under the skin. It is given by a health care professional. Talk to your pediatrician regarding the use of this medicine in children. This medicine is not approved for use in children. Overdosage: If you think you have taken too much of this medicine contact a poison control center or emergency room at once. NOTE: This medicine is only for you. Do not share this medicine with others. What if I miss a dose? This does not apply. What may interact with this medicine? Do not take this medicine with any of the following medications: -adalimumab -anakinra -etanercept -infliximab -medicines to treat cancer -medicines that suppress your immune system This medicine may also interact with the following medications: -immunoglobulins -steroid medicines like prednisone or cortisone This list may not describe all possible interactions. Give your health care provider a list of all the medicines, herbs, non-prescription drugs, or dietary supplements you use. Also tell them if you smoke, drink alcohol, or use illegal drugs. Some items may interact  with your medicine. What should I watch for while using this medicine? Visit your doctor for regular check ups. This vaccine, like all vaccines, may not fully protect everyone. After receiving this vaccine it may be possible to pass chickenpox infection to others. Avoid people with immune system problems, pregnant women who have not had chickenpox, and newborns of women who have not had chickenpox. Talk to your doctor for more information. What side effects may I notice from receiving this medicine? Side effects that you should report to your doctor or health care professional as soon as possible: -allergic reactions like skin rash, itching or hives, swelling of the face, lips, or tongue -breathing problems -feeling faint or lightheaded, falls -fever, flu-like symptoms -pain, tingling, numbness in the hands or feet -swelling of the ankles, feet, hands -unusually weak or tired Side effects that usually do not require medical attention (report to your doctor or health care professional if they continue or are bothersome): -aches or pains -chickenpox-like rash -diarrhea -headache -loss of appetite -nausea, vomiting -redness, pain, swelling at site where injected -runny nose This list may not describe all possible side effects. Call your doctor for medical advice about side effects. You may report side effects to FDA at 1-800-FDA-1088. Where should I keep my medicine? This drug is given in a hospital or clinic and will not be stored at home. NOTE: This sheet is a summary. It may not cover all possible information. If you have questions about this medicine, talk to your doctor, pharmacist, or health care provider.  2014, Elsevier/Gold Standard. (2009-09-02 17:43:50)        Dr Paula Libra Recommendations  For nutrition information, I recommend books:  1).Eat to Live by Dr Excell Seltzer. 2).Prevent and Reverse Heart Disease by Dr Karl Luke. 3) Dr Janene Harvey Book:   Program to Reverse Diabetes  Exercise recommendations are:  If unable to walk, then the patient can exercise in a chair 3 times a day. By flapping arms like a bird gently and raising legs outwards to the front.  If ambulatory, the patient can go for walks for 30 minutes 3 times a week. Then increase the intensity and duration as tolerated.  Goal is to try to attain exercise frequency to 5 times a week.  If applicable: Best to perform resistance exercises (machines or weights) 2 days a week and cardio type exercises 3 days per week.  DASH Diet The DASH diet stands for "Dietary Approaches to Stop Hypertension." It is a healthy eating plan that has been shown to reduce high blood pressure (hypertension) in as little as 14 days, while also possibly providing other significant health benefits. These other health benefits include reducing the risk of breast cancer after menopause and reducing the risk of type 2 diabetes, heart disease, colon cancer, and stroke. Health benefits also include weight loss and slowing kidney failure in patients with chronic kidney disease.  DIET GUIDELINES  Limit salt (sodium). Your diet should contain less than 1500 mg of sodium daily.  Limit refined or processed carbohydrates. Your diet should include mostly whole grains. Desserts and added sugars should be used sparingly.  Include small amounts of heart-healthy fats. These types of fats include nuts, oils, and tub margarine. Limit saturated and trans fats. These fats have been shown to be harmful in the body. CHOOSING FOODS  The following food groups are based on a 2000 calorie diet. See your Registered Dietitian for individual calorie needs. Grains and Grain Products (6 to 8 servings daily)  Eat More Often: Whole-wheat bread, brown rice, whole-grain or wheat pasta, quinoa, popcorn without added fat or salt (air popped).  Eat Less Often: White bread, white pasta, white rice, cornbread. Vegetables (4 to 5 servings  daily)  Eat More Often: Fresh, frozen, and canned vegetables. Vegetables may be raw, steamed, roasted, or grilled with a minimal amount of fat.  Eat Less Often/Avoid: Creamed or fried vegetables. Vegetables in a cheese sauce. Fruit (4 to 5 servings daily)  Eat More Often: All fresh, canned (in natural juice), or frozen fruits. Dried fruits without added sugar. One hundred percent fruit juice ( cup [237 mL] daily).  Eat Less Often: Dried fruits with added sugar. Canned fruit in light or heavy syrup. [employer], Fish, and Poultry (2 servings or less daily. One serving is 3 to 4 oz [85-114 g]).  Eat More Often: Ninety percent or leaner ground beef, tenderloin, sirloin. Round cuts of beef, chicken breast, [place] breast. All fish. Grill, bake, or broil your meat. Nothing should be fried.  Eat Less Often/Avoid: Fatty cuts of meat, [place], or chicken leg, thigh, or wing. Fried cuts of meat or fish. Dairy (2 to 3 servings)  Eat More Often: Low-fat or fat-free milk, low-fat plain or light yogurt, reduced-fat or part-skim cheese.  Eat Less Often/Avoid: Milk (whole, 2%).Whole milk yogurt. Full-fat cheeses. Nuts, Seeds, and Legumes (4 to 5 servings per week)  Eat More Often: All without added salt.  Eat Less Often/Avoid: Salted nuts and seeds, canned beans with added salt. Fats and Sweets (limited)  Eat More Often: Vegetable oils, tub margarines without trans fats, sugar-free gelatin. Mayonnaise and salad dressings.  Eat Less Often/Avoid: Coconut oils,  palm oils, butter, stick margarine, cream, half and half, cookies, candy, pie. FOR MORE INFORMATION The Dash Diet Eating Plan: www.dashdiet.org Document Released: 03/05/2011 Document Revised: 06/08/2011 Document Reviewed: 03/05/2011 [facility] Patient Information 2014 [place], [place].

## 2013-07-08 LAB — LIPID PANEL
Chol/HDL Ratio: 2.5 ratio units (ref 0.0–5.0)
Cholesterol, Total: 173 mg/dL (ref 100–199)
HDL: 70 mg/dL (ref >39)
LDL Calculated: 91 mg/dL (ref 0–99)
Triglycerides: 60 mg/dL (ref 0–149)
VLDL Cholesterol Cal: 12 mg/dL (ref 5–40)

## 2013-07-08 LAB — CMP14+EGFR
ALT: 17 IU/L (ref 0–44)
AST: 21 IU/L (ref 0–40)
Albumin/Globulin Ratio: 1.8 (ref 1.1–2.5)
Albumin: 4.2 g/dL (ref 3.6–4.8)
Alkaline Phosphatase: 84 IU/L (ref 39–117)
BUN/Creatinine Ratio: 18 (ref 10–22)
BUN: 22 mg/dL (ref 8–27)
CO2: 25 mmol/L (ref 18–29)
Calcium: 9.3 mg/dL (ref 8.6–10.2)
Chloride: 106 mmol/L (ref 97–108)
Creatinine, Ser: 1.19 mg/dL (ref 0.76–1.27)
GFR calc Af Amer: 75 mL/min/{1.73_m2} (ref >59)
GFR calc non Af Amer: 65 mL/min/{1.73_m2} (ref >59)
Globulin, Total: 2.3 g/dL (ref 1.5–4.5)
Glucose: 81 mg/dL (ref 65–99)
Potassium: 4.3 mmol/L (ref 3.5–5.2)
Sodium: 146 mmol/L — ABNORMAL HIGH (ref 134–144)
Total Bilirubin: 1.1 mg/dL (ref 0.0–1.2)
Total Protein: 6.5 g/dL (ref 6.0–8.5)

## 2013-07-08 LAB — VITAMIN D 25 HYDROXY (VIT D DEFICIENCY, FRACTURES): Vit D, 25-Hydroxy: 29.4 ng/mL — ABNORMAL LOW (ref 30.0–100.0)

## 2013-08-01 ENCOUNTER — Encounter: Payer: Self-pay | Admitting: Gastroenterology

## 2013-10-03 ENCOUNTER — Encounter: Payer: Self-pay | Admitting: Gastroenterology

## 2013-11-27 ENCOUNTER — Encounter: Payer: No Typology Code available for payment source | Admitting: Gastroenterology

## 2013-12-26 ENCOUNTER — Ambulatory Visit (AMBULATORY_SURGERY_CENTER): Payer: Self-pay

## 2013-12-26 VITALS — Ht 71.0 in | Wt 199.2 lb

## 2013-12-26 DIAGNOSIS — Z86010 Personal history of colonic polyps: Secondary | ICD-10-CM

## 2013-12-26 MED ORDER — MOVIPREP 100 G PO SOLR
1.0000 | Freq: Once | ORAL | Status: DC
Start: 2013-12-26 — End: 2014-01-09

## 2013-12-26 NOTE — Progress Notes (Signed)
No hx past problems with anesthesia (hx tonsillectomy and polypectomy) No allergies to eggs or soy No diet/weight loss meds No home O2 use  [email or web address] (citrix down; emmi not entered)

## 2014-01-04 ENCOUNTER — Encounter: Payer: Self-pay | Admitting: Gastroenterology

## 2014-01-09 ENCOUNTER — Ambulatory Visit (AMBULATORY_SURGERY_CENTER): Payer: 59 | Admitting: Gastroenterology

## 2014-01-09 ENCOUNTER — Encounter: Payer: Self-pay | Admitting: Gastroenterology

## 2014-01-09 VITALS — BP 129/73 | HR 63 | Temp 97.6°F | Resp 18 | Ht 71.0 in | Wt 199.0 lb

## 2014-01-09 DIAGNOSIS — D123 Benign neoplasm of transverse colon: Secondary | ICD-10-CM

## 2014-01-09 DIAGNOSIS — D122 Benign neoplasm of ascending colon: Secondary | ICD-10-CM

## 2014-01-09 DIAGNOSIS — Z86010 Personal history of colonic polyps: Secondary | ICD-10-CM

## 2014-01-09 MED ORDER — SODIUM CHLORIDE 0.9 % IV SOLN: 500.0000 mL | INTRAVENOUS | Status: DC

## 2014-01-09 NOTE — Patient Instructions (Signed)
Discharge instructions given with verbal understanding. Handouts on polyps and hemorrhoids. Resume previous medications. YOU HAD AN ENDOSCOPIC PROCEDURE TODAY AT [facility]: Refer to the procedure report that was given to you for any specific questions about what was found during the examination.  If the procedure report does not answer your questions, please call your gastroenterologist to clarify.  If you requested that your care partner not be given the details of your procedure findings, then the procedure report has been included in a sealed envelope for you to review at your convenience later.  YOU SHOULD EXPECT: Some feelings of bloating in the abdomen. Passage of more gas than usual.  Walking can help get rid of the air that was put into your GI tract during the procedure and reduce the bloating. If you had a lower endoscopy (such as a colonoscopy or flexible sigmoidoscopy) you may notice spotting of blood in your stool or on the toilet paper. If you underwent a bowel prep for your procedure, then you may not have a normal bowel movement for a few days.  DIET: Your first meal following the procedure should be a light meal and then it is ok to progress to your normal diet.  A half-sandwich or bowl of soup is an example of a good first meal.  Heavy or fried foods are harder to digest and may make you feel nauseous or bloated.  Likewise meals heavy in dairy and vegetables can cause extra gas to form and this can also increase the bloating.  Drink plenty of fluids but you should avoid alcoholic beverages for 24 hours.  ACTIVITY: Your care partner should take you home directly after the procedure.  You should plan to take it easy, moving slowly for the rest of the day.  You can resume normal activity the day after the procedure however you should NOT DRIVE or use heavy machinery for 24 hours (because of the sedation medicines used during the test).    SYMPTOMS TO REPORT  IMMEDIATELY: A gastroenterologist can be reached at any hour.  During normal business hours, 8:30 AM to 5:00 PM Monday through Friday, call [phone].  After hours and on weekends, please call the GI answering service at [phone] who will take a message and have the physician on call contact you.   Following lower endoscopy (colonoscopy or flexible sigmoidoscopy):  Excessive amounts of blood in the stool  Significant tenderness or worsening of abdominal pains  Swelling of the abdomen that is new, acute  Fever of 100F or higher  FOLLOW UP: If any biopsies were taken you will be contacted by phone or by letter within the next 1-3 weeks.  Call your gastroenterologist if you have not heard about the biopsies in 3 weeks.  Our staff will call the home number listed on your records the next business day following your procedure to check on you and address any questions or concerns that you may have at that time regarding the information given to you following your procedure. This is a courtesy call and so if there is no answer at the home number and we have not heard from you through the emergency physician on call, we will assume that you have returned to your regular daily activities without incident.  SIGNATURES/CONFIDENTIALITY: You and/or your care partner have signed paperwork which will be entered into your electronic medical record.  These signatures attest to the fact that that the information above on your After Visit Summary  has been reviewed and is understood.  Full responsibility of the confidentiality of this discharge information lies with you and/or your care-partner. 

## 2014-01-09 NOTE — Op Note (Signed)
[address] [employer] [place], [address]   COLONOSCOPY PROCEDURE REPORT  PATIENT: Jeffrey Downs, Jeffrey Downs  MR#: [id number] BIRTHDATE: 1950/08/05 , 63  yrs. old GENDER: male ENDOSCOPIST: Ladene Artist, MD, [facility] PROCEDURE DATE:  01/09/2014 PROCEDURE:   Colonoscopy with biopsy and Colonoscopy with snare polypectomy First Screening Colonoscopy - Avg.  risk and is 50 yrs.  old or older - No.  Prior Negative Screening - Now for repeat screening. N/A  History of Adenoma - Now for follow-up colonoscopy & has been > or = to 3 yrs.  Yes hx of adenoma.  Has been 3 or more years since last colonoscopy.  Polyps Removed Today? Yes. ASA CLASS:   Class II INDICATIONS:surveillance colonoscopy based on a history of adenomatous colonic polyp(s). MEDICATIONS: Monitored anesthesia care and Propofol 220 mg IV DESCRIPTION OF PROCEDURE:   After the risks benefits and alternatives of the procedure were thoroughly explained, informed consent was obtained.  The digital rectal exam revealed no abnormalities of the rectum.   The LB [id number]  endoscope was introduced through the anus and advanced to the cecum, which was identified by both the appendix and ileocecal valve. No adverse events experienced.   The quality of the prep was good, using MoviPrep  The instrument was then slowly withdrawn as the colon was fully examined.  COLON FINDINGS: Three sessile polyps measuring 4 mm in size were found in the transverse colon and ascending colon.  A polypectomy was performed with cold forceps.  The resection was complete, the polyp tissue was completely retrieved and sent to histology.   A sessile polyp measuring 5 mm in size was found in the transverse colon.  A polypectomy was performed with a cold snare.  The resection was complete, the polyp tissue was completely retrieved and sent to histology.   The examination was otherwise normal. Retroflexed views revealed internal Grade I  hemorrhoids. The time to cecum=1 minutes 53 seconds.  Withdrawal time=10 minutes 19 seconds.  The scope was withdrawn and the procedure completed. COMPLICATIONS: There were no immediate complications.  ENDOSCOPIC IMPRESSION: 1.   Three sessile polyps in the transverse colon and ascending colon; polypectomy performed with cold forceps 2.   Sessile polyp found in the transverse colon; polypectomy performed with cold snare 3.    Grade I internal hemorrhoids  RECOMMENDATIONS: 1.  Await pathology results 2.  Repeat Colonoscopy in 5 years.  eSigned:  Ladene Artist, MD, [facility] 01/09/2014 11:11 AM

## 2014-01-09 NOTE — Progress Notes (Signed)
Report to PACU, RN, vss, BBS= Clear.  

## 2014-01-09 NOTE — Progress Notes (Signed)
Called to room to assist during endoscopic procedure.  Patient ID and intended procedure confirmed with present staff. Received instructions for my participation in the procedure from the performing physician.  

## 2014-01-10 ENCOUNTER — Telehealth: Payer: Self-pay | Admitting: *Deleted

## 2014-01-10 NOTE — Telephone Encounter (Signed)
  Follow up Call-  Call back number 01/09/2014  Post procedure Call Back phone  # [phone]  Permission to leave phone message Yes     Patient questions:  Do you have a fever, pain , or abdominal swelling? No. Pain Score  0 *  Have you tolerated food without any problems? Yes.    Have you been able to return to your normal activities? Yes.    Do you have any questions about your discharge instructions: Diet   No. Medications  No. Follow up visit  No.  Do you have questions or concerns about your Care? No.  Actions: * If pain score is 4 or above: No action needed, pain <4.

## 2014-01-16 ENCOUNTER — Encounter: Payer: Self-pay | Admitting: Gastroenterology

## 2014-03-28 ENCOUNTER — Encounter: Payer: Self-pay | Admitting: Family Medicine

## 2014-03-28 ENCOUNTER — Ambulatory Visit (INDEPENDENT_AMBULATORY_CARE_PROVIDER_SITE_OTHER): Payer: 59 | Admitting: Family Medicine

## 2014-03-28 VITALS — BP 137/82 | HR 68 | Temp 98.1°F | Ht 71.0 in | Wt 190.8 lb

## 2014-03-28 DIAGNOSIS — E785 Hyperlipidemia, unspecified: Secondary | ICD-10-CM

## 2014-03-28 DIAGNOSIS — J40 Bronchitis, not specified as acute or chronic: Secondary | ICD-10-CM

## 2014-03-28 DIAGNOSIS — I10 Essential (primary) hypertension: Secondary | ICD-10-CM

## 2014-03-28 LAB — POCT CBC
Granulocyte percent: 50.9 %G (ref 37–80)
HCT, POC: 44.3 % (ref 43.5–53.7)
Hemoglobin: 13.6 g/dL — AB (ref 14.1–18.1)
Lymph, poc: 1.8 (ref 0.6–3.4)
MCH, POC: 26.1 pg — AB (ref 27–31.2)
MCHC: 30.6 g/dL — AB (ref 31.8–35.4)
MCV: 85.1 fL (ref 80–97)
MPV: 8.6 fL (ref 0–99.8)
POC Granulocyte: 2.3 (ref 2–6.9)
POC LYMPH PERCENT: 38.7 %L (ref 10–50)
Platelet Count, POC: 209 10*3/uL (ref 142–424)
RBC: 5.2 M/uL (ref 4.69–6.13)
RDW, POC: 14.1 %
WBC: 4.6 10*3/uL (ref 4.6–10.2)

## 2014-03-28 MED ORDER — AZITHROMYCIN 250 MG PO TABS: ORAL_TABLET | ORAL | Status: DC

## 2014-03-28 MED ORDER — SIMVASTATIN 20 MG PO TABS: 10.0000 mg | ORAL_TABLET | Freq: Every day | ORAL | Status: DC

## 2014-03-28 MED ORDER — LISINOPRIL 20 MG PO TABS: ORAL_TABLET | ORAL | Status: DC

## 2014-03-28 MED ORDER — AMLODIPINE BESYLATE 10 MG PO TABS: ORAL_TABLET | ORAL | Status: DC

## 2014-03-28 MED ORDER — BENZONATATE 100 MG PO CAPS: 100.0000 mg | ORAL_CAPSULE | Freq: Three times a day (TID) | ORAL | Status: DC | PRN

## 2014-03-28 MED ORDER — ALBUTEROL SULFATE HFA 108 (90 BASE) MCG/ACT IN AERS: 2.0000 | INHALATION_SPRAY | Freq: Four times a day (QID) | RESPIRATORY_TRACT | Status: DC | PRN

## 2014-03-28 NOTE — Progress Notes (Signed)
   Subjective:    Patient ID: Jeffrey Downs, male    DOB: 1950-12-30, 63 y.o.   MRN: [id number]  HPI Patient is here for follow up.  He has hx of htn and hyperlipidemia.  He is c/o cough and uri sx's for over a week.  Review of Systems  Constitutional: Negative for fever.  HENT: Negative for ear pain.   Eyes: Negative for discharge.  Respiratory: Negative for cough.   Cardiovascular: Negative for chest pain.  Gastrointestinal: Negative for abdominal distention.  Endocrine: Negative for polyuria.  Genitourinary: Negative for difficulty urinating.  Musculoskeletal: Negative for gait problem and neck pain.  Skin: Negative for color change and rash.  Neurological: Negative for speech difficulty and headaches.  Psychiatric/Behavioral: Negative for agitation.       Objective:    BP 137/82 mmHg  Pulse 68  Temp(Src) 98.1 F (36.7 C) (Oral)  Ht 5\' 11"  (1.803 m)  Wt 190 lb 12.8 oz (86.546 kg)  BMI 26.62 kg/m2 Physical Exam  Constitutional: He is oriented to person, place, and time. He appears well-developed and well-nourished.  HENT:  Head: Normocephalic and atraumatic.  Mouth/Throat: Oropharynx is clear and moist.  Eyes: Pupils are equal, round, and reactive to light.  Neck: Normal range of motion. Neck supple.  Cardiovascular: Normal rate and regular rhythm.   No murmur heard. Pulmonary/Chest: Effort normal and breath sounds normal.  Abdominal: Soft. Bowel sounds are normal. There is no tenderness.  Neurological: He is alert and oriented to person, place, and time.  Skin: Skin is warm and dry.  Psychiatric: He has a normal mood and affect.          Assessment & Plan:     ICD-9-CM ICD-10-CM   1. Essential hypertension 401.9 I10 lisinopril (PRINIVIL,ZESTRIL) 20 MG tablet     amLODipine (NORVASC) 10 MG tablet  2. Bronchitis 490 J40 albuterol (PROVENTIL HFA;VENTOLIN HFA) 108 (90 BASE) MCG/ACT inhaler     No Follow-up on file.  Lysbeth Penner FNP

## 2014-03-29 ENCOUNTER — Other Ambulatory Visit: Payer: Self-pay | Admitting: Family Medicine

## 2014-03-29 LAB — CMP14+EGFR
ALT: 13 IU/L (ref 0–44)
AST: 19 IU/L (ref 0–40)
Albumin/Globulin Ratio: 1.8 (ref 1.1–2.5)
Albumin: 4.2 g/dL (ref 3.6–4.8)
Alkaline Phosphatase: 93 IU/L (ref 39–117)
BUN/Creatinine Ratio: 15 (ref 10–22)
BUN: 17 mg/dL (ref 8–27)
CO2: 22 mmol/L (ref 18–29)
Calcium: 9.4 mg/dL (ref 8.6–10.2)
Chloride: 106 mmol/L (ref 97–108)
Creatinine, Ser: 1.13 mg/dL (ref 0.76–1.27)
GFR calc Af Amer: 79 mL/min/{1.73_m2} (ref >59)
GFR calc non Af Amer: 68 mL/min/{1.73_m2} (ref >59)
Globulin, Total: 2.3 g/dL (ref 1.5–4.5)
Glucose: 85 mg/dL (ref 65–99)
Potassium: 3.9 mmol/L (ref 3.5–5.2)
Sodium: 144 mmol/L (ref 134–144)
Total Bilirubin: 0.9 mg/dL (ref 0.0–1.2)
Total Protein: 6.5 g/dL (ref 6.0–8.5)

## 2014-03-29 LAB — LIPID PANEL
Chol/HDL Ratio: 2.6 ratio units (ref 0.0–5.0)
Cholesterol, Total: 184 mg/dL (ref 100–199)
HDL: 70 mg/dL (ref >39)
LDL Calculated: 96 mg/dL (ref 0–99)
Triglycerides: 92 mg/dL (ref 0–149)
VLDL Cholesterol Cal: 18 mg/dL (ref 5–40)

## 2014-03-29 LAB — VITAMIN D 25 HYDROXY (VIT D DEFICIENCY, FRACTURES): Vit D, 25-Hydroxy: 28.4 ng/mL — ABNORMAL LOW (ref 30.0–100.0)

## 2014-03-29 LAB — PSA, TOTAL AND FREE
PSA, Free Pct: 28.9 %
PSA, Free: 0.52 ng/mL
PSA: 1.8 ng/mL (ref 0.0–4.0)

## 2014-03-29 LAB — TSH: TSH: 3.69 u[IU]/mL (ref 0.450–4.500)

## 2014-03-29 MED ORDER — VITAMIN D (ERGOCALCIFEROL) 1.25 MG (50000 UNIT) PO CAPS: 50000.0000 [IU] | ORAL_CAPSULE | ORAL | Status: DC

## 2014-06-28 ENCOUNTER — Ambulatory Visit (INDEPENDENT_AMBULATORY_CARE_PROVIDER_SITE_OTHER): Payer: 59 | Admitting: Family Medicine

## 2014-06-28 ENCOUNTER — Encounter: Payer: Self-pay | Admitting: Family Medicine

## 2014-06-28 VITALS — BP 130/81 | HR 72 | Temp 96.8°F | Ht 71.0 in | Wt 193.6 lb

## 2014-06-28 DIAGNOSIS — I10 Essential (primary) hypertension: Secondary | ICD-10-CM

## 2014-06-28 DIAGNOSIS — E785 Hyperlipidemia, unspecified: Secondary | ICD-10-CM | POA: Diagnosis not present

## 2014-06-28 DIAGNOSIS — E559 Vitamin D deficiency, unspecified: Secondary | ICD-10-CM

## 2014-06-28 MED ORDER — LISINOPRIL 20 MG PO TABS: ORAL_TABLET | ORAL | Status: DC

## 2014-06-28 MED ORDER — AMLODIPINE BESYLATE 10 MG PO TABS
ORAL_TABLET | ORAL | Status: DC
Start: 2014-06-28 — End: 2014-09-03

## 2014-06-28 MED ORDER — SIMVASTATIN 20 MG PO TABS: 10.0000 mg | ORAL_TABLET | Freq: Every day | ORAL | Status: DC

## 2014-06-28 NOTE — Patient Instructions (Signed)
Consider 10 lbs. Weight loss. Low cab approach with moderate exercise

## 2014-06-28 NOTE — Progress Notes (Signed)
Subjective:  Patient ID: Jeffrey Downs, male    DOB: 1950-06-06  Age: 64 y.o. MRN: [id number]  CC: Hypertension; Hyperlipidemia; and Vit D deficiency   HPI Jeffrey Downs presents for 1/10 in back, 6/10 in right knee. Feet still burning. Worse with stress. 1-5/10 based on the day & stress level.   follow-up of hypertension. Patient has no history of headache chest pain or shortness of breath or recent cough. Patient also denies symptoms of TIA such as numbness weakness lateralizing. Patient checks  blood pressure at home and has not had any elevated readings recently. Patient denies side effects from his medication. States taking it regularly. Patient in for follow-up of elevated cholesterol. Doing well without complaints on current medication. Denies side effects of statin including myalgia and arthralgia and nausea. Also in today for liver function testing. Currently no chest pain, shortness of breath or other cardiovascular related symptoms noted.  Also concerned about history of low vitamin D. His finish the 50,000 units prescription and now 1 taking it 2000 a day.  History Jeffrey Downs has a past medical history of Hypertension; Hyperlipidemia; Kidney stones; and Vitamin D deficiency.   He has past surgical history that includes Tonsillectomy and Colonoscopy w/ polypectomy.   His family history includes Cancer in his father; Hyperlipidemia in his sister; Hypertension in his sister. There is no history of Colon cancer.He reports that he has never smoked. He has never used smokeless tobacco. He reports that he does not drink alcohol or use illicit drugs.  Current Outpatient Prescriptions on File Prior to Visit  Medication Sig Dispense Refill  . aspirin 81 MG chewable tablet Chew 81 mg by mouth daily.    . Cholecalciferol (VITAMIN D3) 2000 UNITS TABS Take 1 capsule by mouth daily.    . Omega-3 Fatty Acids (FISH OIL) 1000 MG CAPS Take 1,000 mg by mouth every other day.    Marland Kitchen OVER THE COUNTER  MEDICATION Centrum Silver 50+ Mens     No current facility-administered medications on file prior to visit.    ROS Review of Systems  Constitutional: Negative for fever, chills, diaphoresis and unexpected weight change.  HENT: Negative for congestion, hearing loss, rhinorrhea, sore throat and trouble swallowing.   Respiratory: Negative for cough, chest tightness, shortness of breath and wheezing.   Gastrointestinal: Negative for nausea, vomiting, abdominal pain, diarrhea, constipation and abdominal distention.  Endocrine: Negative for cold intolerance and heat intolerance.  Genitourinary: Negative for dysuria, hematuria and flank pain.  Musculoskeletal: Negative for joint swelling and arthralgias.  Skin: Negative for rash.  Neurological: Negative for dizziness and headaches.  Psychiatric/Behavioral: Negative for dysphoric mood, decreased concentration and agitation. The patient is not nervous/anxious.     Objective:  BP 130/81 mmHg  Pulse 72  Temp(Src) 96.8 F (36 C) (Oral)  Ht _0  (1.803 m)  Wt 193 lb 9.6 oz (87.816 kg)  BMI 27.01 kg/m2  BP Readings from Last 3 Encounters:  06/28/14 130/81  03/28/14 137/82  01/09/14 129/73    Wt Readings from Last 3 Encounters:  06/28/14 193 lb 9.6 oz (87.816 kg)  03/28/14 190 lb 12.8 oz (86.546 kg)  01/09/14 199 lb (90.266 kg)     Physical Exam  Constitutional: He is oriented to person, place, and time. He appears well-developed and well-nourished. No distress.  HENT:  Head: Normocephalic and atraumatic.  Right Ear: External ear normal.  Left Ear: External ear normal.  Nose: Nose normal.  Mouth/Throat: Oropharynx is clear and moist.  Eyes: Conjunctivae and EOM are normal. Pupils are equal, round, and reactive to light.  Neck: Normal range of motion. Neck supple. No thyromegaly present.  Cardiovascular: Normal rate, regular rhythm and normal heart sounds.   No murmur heard. Pulmonary/Chest: Effort normal and breath sounds  normal. No respiratory distress. He has no wheezes. He has no rales.  Abdominal: Soft. Bowel sounds are normal. He exhibits no distension. There is no tenderness.  Lymphadenopathy:    He has no cervical adenopathy.  Neurological: He is alert and oriented to person, place, and time. He has normal reflexes.  Skin: Skin is warm and dry.  Psychiatric: He has a normal mood and affect. His behavior is normal. Judgment and thought content normal.    No results found for: HGBA1C  Lab Results  Component Value Date   WBC 4.6 03/28/2014   HGB 13.6* 03/28/2014   HCT 44.3 03/28/2014   PLT 155 07/26/2011   GLUCOSE 85 03/28/2014   CHOL 184 03/28/2014   TRIG 92 03/28/2014   HDL 70 03/28/2014   LDLCALC 96 03/28/2014   ALT 13 03/28/2014   AST 19 03/28/2014   NA 144 03/28/2014   K 3.9 03/28/2014   CL 106 03/28/2014   CREATININE 1.13 03/28/2014   BUN 17 03/28/2014   CO2 22 03/28/2014   TSH 3.690 03/28/2014   PSA 1.8 03/28/2014    Ct Abdomen Pelvis W Contrast  07/26/2011   *RADIOLOGY REPORT*  Clinical Data: Left lower quadrant abdominal pain with nausea and vomiting.  CT ABDOMEN AND PELVIS WITH CONTRAST  Technique:  Multidetector CT imaging of the abdomen and pelvis was performed following the standard protocol during bolus administration of intravenous contrast.  Contrast: 167m OMNIPAQUE IOHEXOL 300 MG/ML  SOLN  Comparison: None.  Findings: There is a 4 mm calculus located either at the left ureterovesicle junction or just into the lumen of the bladder. Associated left-sided hydroureter and hydronephrosis present as well as evidence of forniceal rupture with perinephric fluid present.  Additional calculus in the mid collecting system of the left kidney measures 10 mm in height.  No right-sided renal calculi or obstruction.  The liver, gallbladder, pancreas, spleen, adrenal glands and bowel are within normal limits.  No hernias.  No abscess.  The prostate gland shows moderate enlargement.  No enlarged  lymph nodes or incidental masses.  Bony structures show mild degenerative changes of the spine.  IMPRESSION: Left-sided renal obstruction with hydronephrosis, hydroureter and evidence of forniceal decompression due to a 4 mm calculus located either at the left UVJ or just into the lumen of the bladder. There is an additional 10 mm calculus in the mid left kidney.  Original Report Authenticated By: GAzzie Roup M.D.   Assessment & Plan:   RRolandowas seen today for hypertension, hyperlipidemia and vit d deficiency.  Diagnoses and all orders for this visit:  Essential hypertension Orders: -     lisinopril (PRINIVIL,ZESTRIL) 20 MG tablet; TAKE ONE TABLET BY MOUTH ONCE DAILY -     amLODipine (NORVASC) 10 MG tablet; TAKE ONE TABLET BY MOUTH ONCE DAILY  Vitamin D deficiency Orders: -     Vit D  25 hydroxy (rtn osteoporosis monitoring)  Hyperlipidemia Orders: -     simvastatin (ZOCOR) 20 MG tablet; Take 0.5 tablets (10 mg total) by mouth daily at 6 PM. Use as directed -     NMR, lipoprofile  Other orders -     Cancel: POCT glycosylated hemoglobin (Hb A1C) -  Cancel: CMP14+EGFR -     Cancel: Lipid panel   I have discontinued Jeffrey Downs's albuterol, azithromycin, benzonatate, and Vitamin D (Ergocalciferol). I am also having him maintain his aspirin, Vitamin D3, Fish Oil, OVER THE COUNTER MEDICATION, simvastatin, lisinopril, and amLODipine.  Meds ordered this encounter  Medications  . simvastatin (ZOCOR) 20 MG tablet    Sig: Take 0.5 tablets (10 mg total) by mouth daily at 6 PM. Use as directed    Dispense:  90 tablet    Refill:  3  . lisinopril (PRINIVIL,ZESTRIL) 20 MG tablet    Sig: TAKE ONE TABLET BY MOUTH ONCE DAILY    Dispense:  90 tablet    Refill:  3  . amLODipine (NORVASC) 10 MG tablet    Sig: TAKE ONE TABLET BY MOUTH ONCE DAILY    Dispense:  90 tablet    Refill:  3     Follow-up: No Follow-up on file.  Claretta Fraise, M.D.

## 2014-06-29 LAB — NMR, LIPOPROFILE
Cholesterol: 177 mg/dL (ref 100–199)
HDL Cholesterol by NMR: 70 mg/dL (ref >39)
HDL Particle Number: 36.2 umol/L (ref >=30.5)
LDL PARTICLE NUMBER: 951 nmol/L (ref <1000)
LDL SIZE: 21.2 nm (ref >20.5)
LDL-C: 90 mg/dL (ref 0–99)
LP-IR Score: <25 (ref <=45)
Small LDL Particle Number: 228 nmol/L (ref <=527)
Triglycerides by NMR: 83 mg/dL (ref 0–149)

## 2014-06-29 LAB — VITAMIN D 25 HYDROXY (VIT D DEFICIENCY, FRACTURES): VIT D 25 HYDROXY: 38.4 ng/mL (ref 30.0–100.0)

## 2014-07-02 ENCOUNTER — Telehealth: Payer: Self-pay | Admitting: *Deleted

## 2014-07-02 NOTE — Telephone Encounter (Signed)
-----   Message from Claretta Fraise, MD sent at 06/29/2014  4:16 PM EDT ----- Dear Audry Pili,   Your Vitamin D is quite low. You need a high dose supplement. Use 50,000 units twice weekly for two months. I will send that in as a 1 time only prescription.  Then switch to OTC Vitamin D 2000 units daily. Recheck vitamin D level with office visit in 6 months.  Best regards, Claretta Fraise M.D.

## 2014-07-05 MED ORDER — ERGOCALCIFEROL 1.25 MG (50000 UT) PO CAPS: 50000.0000 [IU] | ORAL_CAPSULE | ORAL | Status: DC

## 2014-07-05 NOTE — Telephone Encounter (Signed)
-----   Message from Claretta Fraise, MD sent at 06/29/2014  4:16 PM EDT ----- Dear Jeffrey Downs,   Your Vitamin D is quite low. You need a high dose supplement. Use 50,000 units twice weekly for two months. I will send that in as a 1 time only prescription.  Then switch to OTC Vitamin D 2000 units daily. Recheck vitamin D level with office visit in 6 months.  Best regards, Claretta Fraise M.D.

## 2014-07-05 NOTE — Telephone Encounter (Signed)
Pt notified of results Verbalizes understanding 

## 2014-09-03 ENCOUNTER — Other Ambulatory Visit: Payer: Self-pay | Admitting: *Deleted

## 2014-09-03 DIAGNOSIS — I10 Essential (primary) hypertension: Secondary | ICD-10-CM

## 2014-09-03 DIAGNOSIS — E785 Hyperlipidemia, unspecified: Secondary | ICD-10-CM

## 2014-09-03 MED ORDER — LISINOPRIL 20 MG PO TABS: ORAL_TABLET | ORAL | Status: DC

## 2014-09-03 MED ORDER — SIMVASTATIN 20 MG PO TABS: 10.0000 mg | ORAL_TABLET | Freq: Every day | ORAL | Status: DC

## 2014-09-03 MED ORDER — AMLODIPINE BESYLATE 10 MG PO TABS: ORAL_TABLET | ORAL | Status: DC

## 2014-09-17 IMAGING — CR DG CHEST 2V
2 series · 2 of 2 positions shown · non-contrast
Comparison: None.

CLINICAL DATA: Cough and congestion

CHEST - 2 VIEW

[view not recorded (1 of 2)]
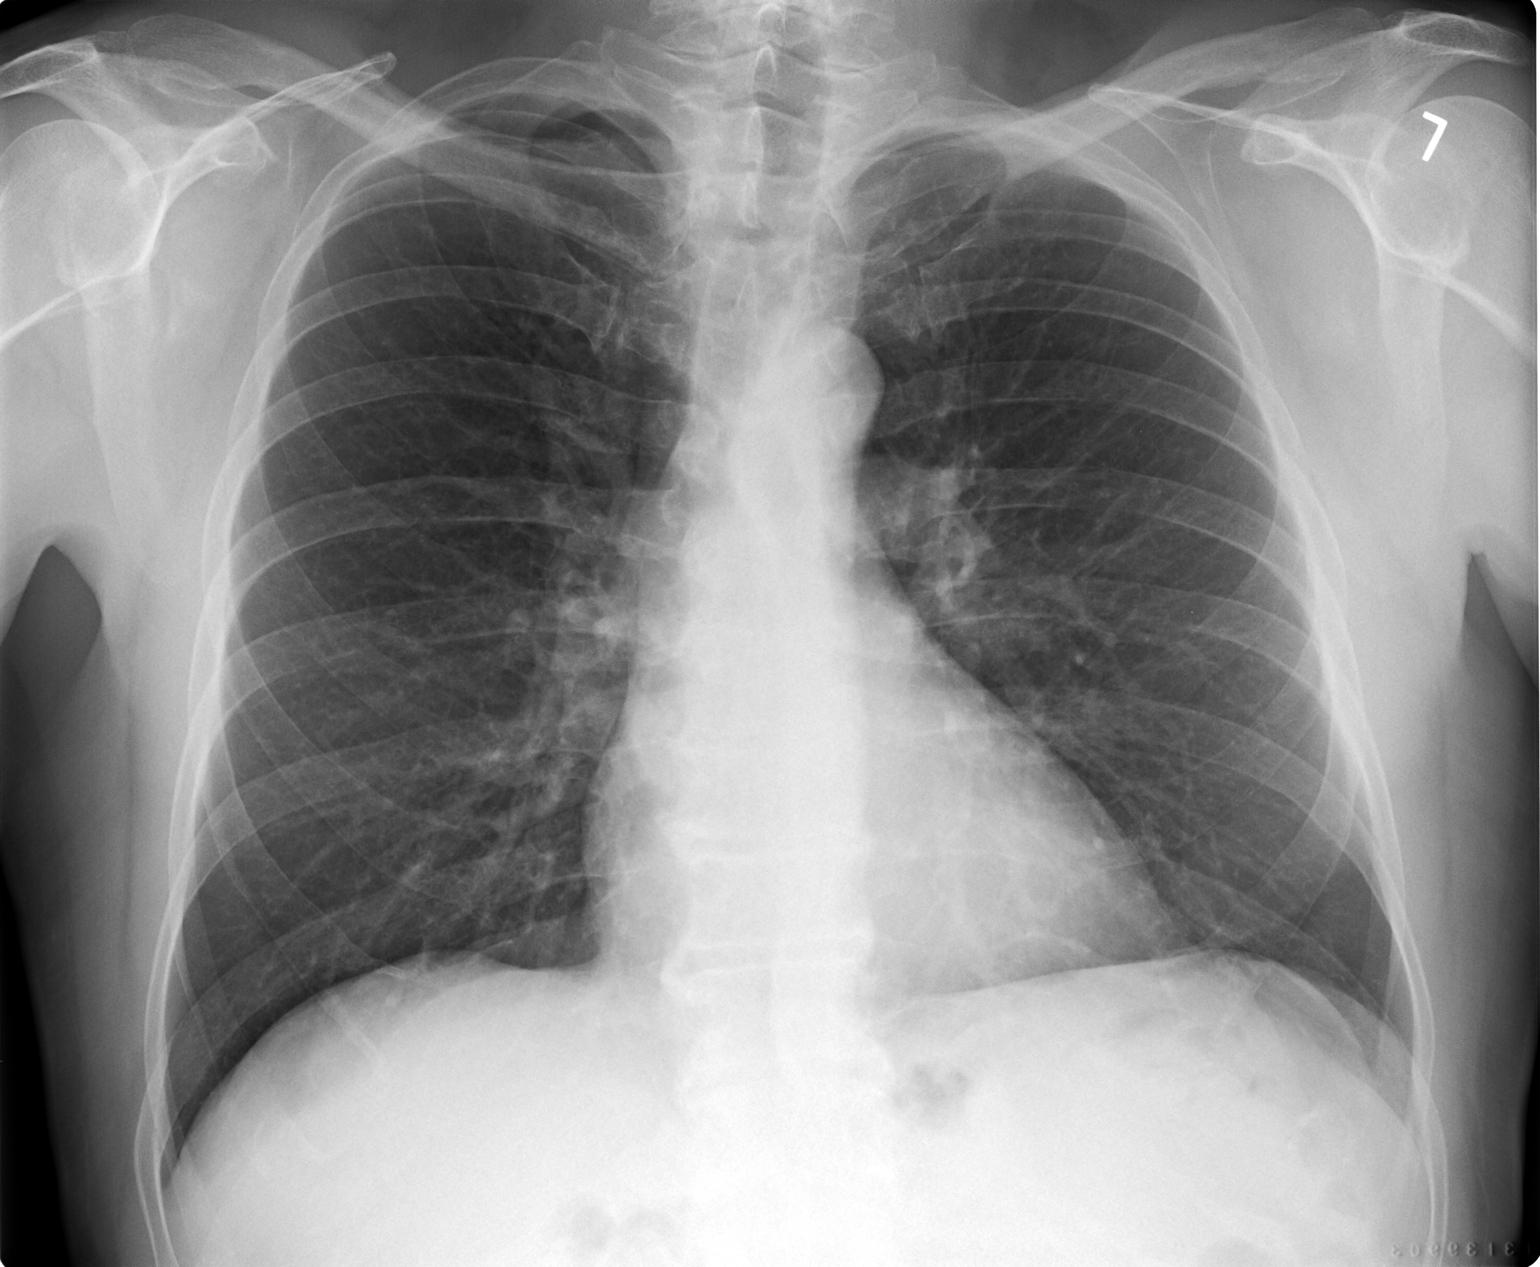

[view not recorded (2 of 2)]
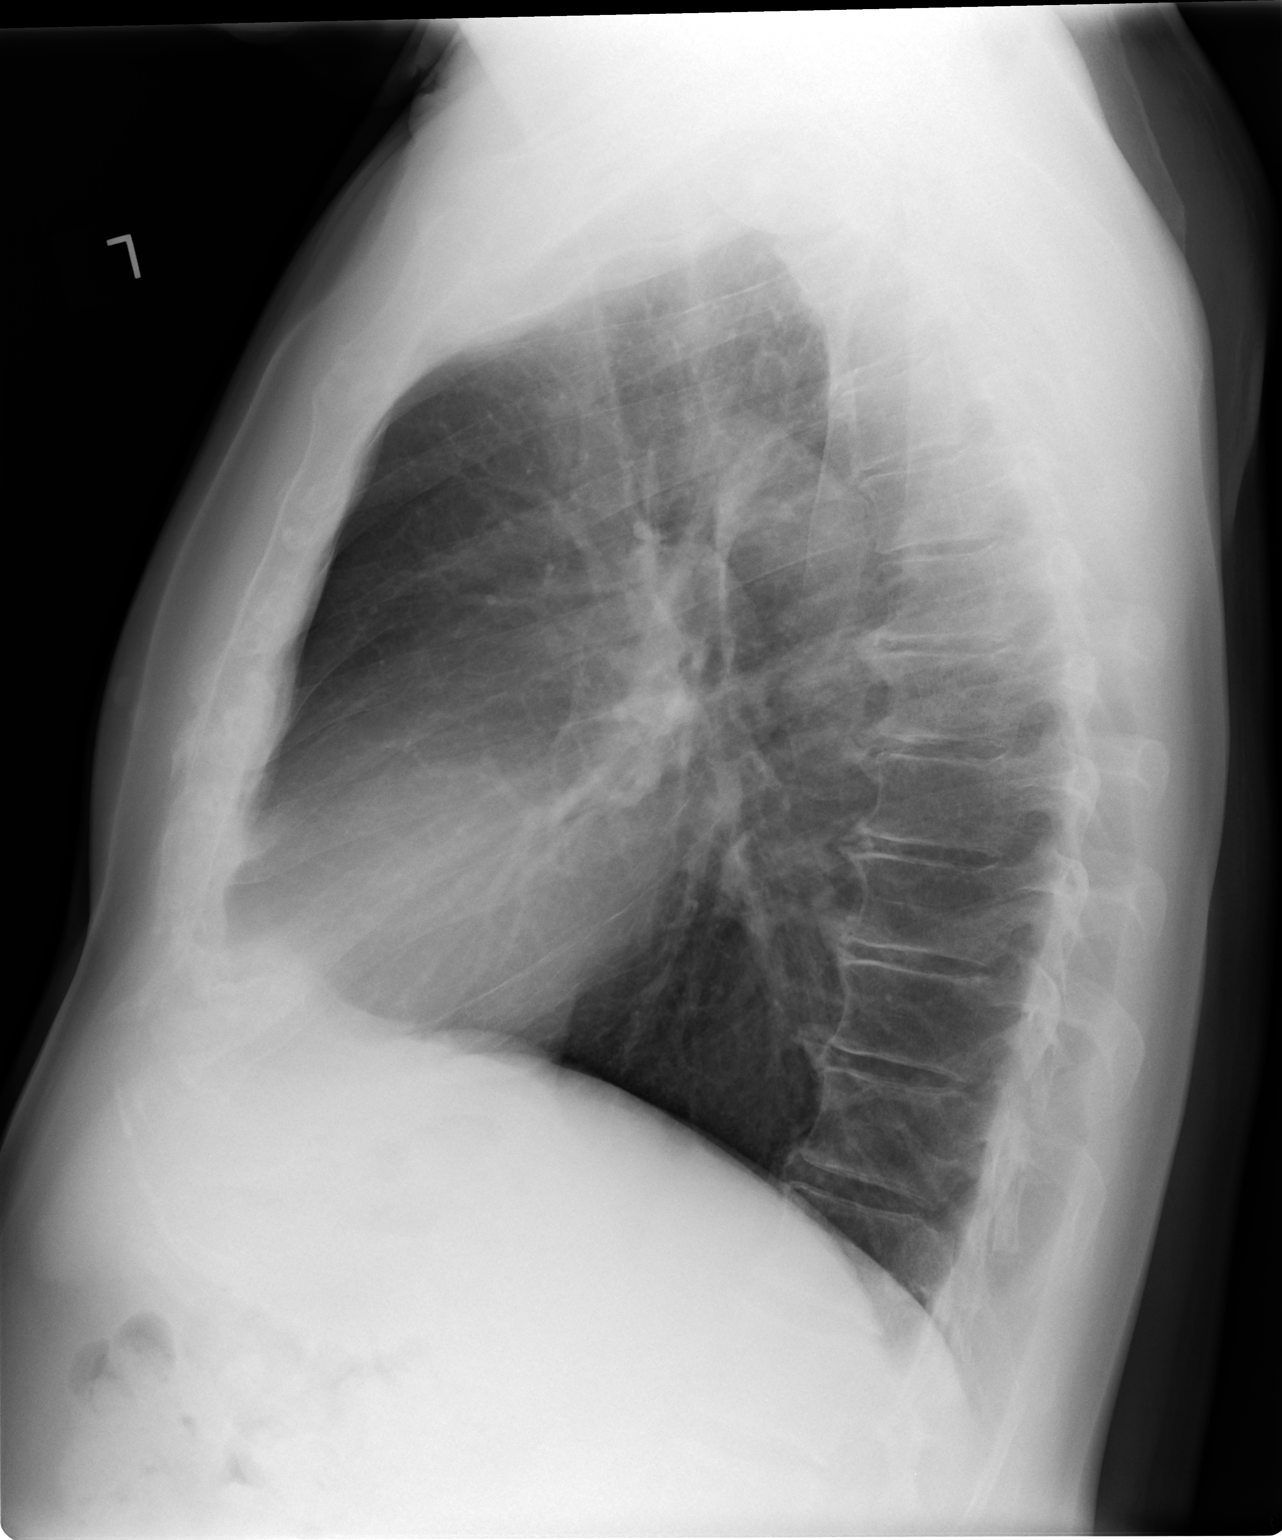

[2 of 2 positions shown; findings below may reference images not displayed]

FINDINGS: Lungs clear.  Heart size and pulmonary vascularity are
normal.  No adenopathy.  There is degenerative change in the
thoracic spine.
IMPRESSION: No edema or consolidation.

## 2014-10-19 ENCOUNTER — Encounter: Payer: Self-pay | Admitting: *Deleted

## 2014-12-25 NOTE — Progress Notes (Signed)
Subjective:  Patient ID: Jeffrey Downs, male    DOB: 05-10-1950  Age: 64 y.o. MRN: [id number]  CC: Annual Exam; Hypertension; and Hyperlipidemia   HPI Jeffrey Downs presents for complete physical examination.  History Jeffrey Downs has a past medical history of Hypertension; Hyperlipidemia; Kidney stones; and Vitamin D deficiency.   He has past surgical history that includes Tonsillectomy and Colonoscopy w/ polypectomy.   His family history includes Cancer in his father; Hyperlipidemia in his sister; Hypertension in his sister. There is no history of Colon cancer.He reports that he has never smoked. He has never used smokeless tobacco. He reports that he does not drink alcohol or use illicit drugs.  Outpatient Prescriptions Prior to Visit  Medication Sig Dispense Refill  . amLODipine (NORVASC) 10 MG tablet TAKE ONE TABLET BY MOUTH ONCE DAILY 90 tablet 1  . aspirin 81 MG chewable tablet Chew 81 mg by mouth daily.    . Cholecalciferol (VITAMIN D3) 2000 UNITS TABS Take 1 capsule by mouth daily.    Marland Kitchen lisinopril (PRINIVIL,ZESTRIL) 20 MG tablet TAKE ONE TABLET BY MOUTH ONCE DAILY 90 tablet 1  . Omega-3 Fatty Acids (FISH OIL) 1000 MG CAPS Take 1,000 mg by mouth every other day.    Marland Kitchen OVER THE COUNTER MEDICATION Centrum Silver 50+ Mens    . simvastatin (ZOCOR) 20 MG tablet Take 0.5 tablets (10 mg total) by mouth daily at 6 PM. Use as directed 90 tablet 1  . ergocalciferol (VITAMIN D2) 50000 UNITS capsule Take 1 capsule (50,000 Units total) by mouth 2 (two) times a week. (Patient not taking: Reported on 12/26/2014) 16 capsule 0   No facility-administered medications prior to visit.    ROS Review of Systems  Constitutional: Negative for fever, chills, diaphoresis, activity change, appetite change, fatigue and unexpected weight change.  HENT: Negative for congestion, ear pain, hearing loss, postnasal drip, rhinorrhea, sore throat, tinnitus and trouble swallowing.   Eyes: Negative for photophobia,  pain, discharge and redness.  Respiratory: Negative for apnea, cough, choking, chest tightness, shortness of breath, wheezing and stridor.   Cardiovascular: Negative for chest pain, palpitations and leg swelling.  Gastrointestinal: Negative for nausea, vomiting, abdominal pain, diarrhea, constipation, blood in stool and abdominal distention.  Endocrine: Negative for cold intolerance, heat intolerance, polydipsia, polyphagia and polyuria.  Genitourinary: Negative for dysuria, urgency, frequency, hematuria, flank pain, enuresis, difficulty urinating and genital sores.  Musculoskeletal: Negative for joint swelling and arthralgias.  Skin: Negative for color change, rash and wound.  Allergic/Immunologic: Negative for immunocompromised state.  Neurological: Negative for dizziness, tremors, seizures, syncope, facial asymmetry, speech difficulty, weakness, light-headedness, numbness and headaches.  Hematological: Does not bruise/bleed easily.  Psychiatric/Behavioral: Negative for suicidal ideas, hallucinations, behavioral problems, confusion, sleep disturbance, dysphoric mood, decreased concentration and agitation. The patient is not nervous/anxious and is not hyperactive.     Objective:  BP 136/76 mmHg  Pulse 62  Temp(Src) 97.6 F (36.4 C) (Oral)  Ht 5' 10"  (1.778 m)  Wt 190 lb 12.8 oz (86.546 kg)  BMI 27.38 kg/m2  BP Readings from Last 3 Encounters:  12/26/14 136/76  06/28/14 130/81  03/28/14 137/82    Wt Readings from Last 3 Encounters:  12/26/14 190 lb 12.8 oz (86.546 kg)  06/28/14 193 lb 9.6 oz (87.816 kg)  03/28/14 190 lb 12.8 oz (86.546 kg)     Physical Exam  Constitutional: He is oriented to person, place, and time. He appears well-developed and well-nourished.  HENT:  Head: Normocephalic and atraumatic.  Mouth/Throat:  Oropharynx is clear and moist.  Eyes: EOM are normal. Pupils are equal, round, and reactive to light.  Neck: Normal range of motion. No tracheal deviation  present. No thyromegaly present.  Cardiovascular: Normal rate, regular rhythm and normal heart sounds.  Exam reveals no gallop and no friction rub.   No murmur heard. Pulmonary/Chest: Breath sounds normal. He has no wheezes. He has no rales.  Abdominal: Soft. He exhibits no mass. There is no tenderness.  Genitourinary: Rectum normal, prostate normal and penis normal. No penile tenderness.  Musculoskeletal: Normal range of motion. He exhibits no edema.  Neurological: He is alert and oriented to person, place, and time. He has normal reflexes. No cranial nerve deficit. He exhibits normal muscle tone.  Skin: Skin is warm and dry.  Psychiatric: He has a normal mood and affect.    No results found for: HGBA1C  Lab Results  Component Value Date   WBC 4.4 12/26/2014   HGB 13.6* 03/28/2014   HCT 45.0 12/26/2014   PLT 155 07/26/2011   GLUCOSE 86 12/26/2014   CHOL 166 12/26/2014   TRIG 59 12/26/2014   HDL 77 12/26/2014   LDLCALC 77 12/26/2014   ALT 18 12/26/2014   AST 27 12/26/2014   NA 147* 12/26/2014   K 4.2 12/26/2014   CL 105 12/26/2014   CREATININE 1.17 12/26/2014   BUN 23 12/26/2014   CO2 24 12/26/2014   TSH 3.690 03/28/2014   PSA 1.2 12/26/2014    Ct Abdomen Pelvis W Contrast  07/26/2011   *RADIOLOGY REPORT*  Clinical Data: Left lower quadrant abdominal pain with nausea and vomiting.  CT ABDOMEN AND PELVIS WITH CONTRAST  Technique:  Multidetector CT imaging of the abdomen and pelvis was performed following the standard protocol during bolus administration of intravenous contrast.  Contrast: 165m OMNIPAQUE IOHEXOL 300 MG/ML  SOLN  Comparison: None.  Findings: There is a 4 mm calculus located either at the left ureterovesicle junction or just into the lumen of the bladder. Associated left-sided hydroureter and hydronephrosis present as well as evidence of forniceal rupture with perinephric fluid present.  Additional calculus in the mid collecting system of the left kidney measures  10 mm in height.  No right-sided renal calculi or obstruction.  The liver, gallbladder, pancreas, spleen, adrenal glands and bowel are within normal limits.  No hernias.  No abscess.  The prostate gland shows moderate enlargement.  No enlarged lymph nodes or incidental masses.  Bony structures show mild degenerative changes of the spine.  IMPRESSION: Left-sided renal obstruction with hydronephrosis, hydroureter and evidence of forniceal decompression due to a 4 mm calculus located either at the left UVJ or just into the lumen of the bladder. There is an additional 10 mm calculus in the mid left kidney.  Original Report Authenticated By: GAzzie Roup M.D.   Subjective:   RKhiry PasquarielloBullins is a 64y.o. male who presents for a Welcome to Medicare exam.          Objective:    Today's Vitals   12/26/14 0911  BP: 136/76  Pulse: 62  Temp: 97.6 F (36.4 C)  TempSrc: Oral  Height: 5' 10"  (1.778 m)  Weight: 190 lb 12.8 oz (86.546 kg)   Body mass index is 27.38 kg/(m^2).  Medications Outpatient Encounter Prescriptions as of 12/26/2014  Medication Sig  . amLODipine (NORVASC) 10 MG tablet TAKE ONE TABLET BY MOUTH ONCE DAILY  . aspirin 81 MG chewable tablet Chew 81 mg by mouth daily.  .Marland Kitchen  Cholecalciferol (VITAMIN D3) 2000 UNITS TABS Take 1 capsule by mouth daily.  Marland Kitchen lisinopril (PRINIVIL,ZESTRIL) 20 MG tablet TAKE ONE TABLET BY MOUTH ONCE DAILY  . Omega-3 Fatty Acids (FISH OIL) 1000 MG CAPS Take 1,000 mg by mouth every other day.  Marland Kitchen OVER THE COUNTER MEDICATION Centrum Silver 50+ Mens  . simvastatin (ZOCOR) 20 MG tablet Take 0.5 tablets (10 mg total) by mouth daily at 6 PM. Use as directed  . Cholecalciferol (VITAMIN D) 2000 UNITS tablet Take 1 tablet (2,000 Units total) by mouth daily.  . [DISCONTINUED] ergocalciferol (VITAMIN D2) 50000 UNITS capsule Take 1 capsule (50,000 Units total) by mouth 2 (two) times a week. (Patient not taking: Reported on 12/26/2014)   No facility-administered  encounter medications on file as of 12/26/2014.     History: Past Medical History  Diagnosis Date  . Hypertension   . Hyperlipidemia   . Kidney stones   . Vitamin D deficiency    Past Surgical History  Procedure Laterality Date  . Tonsillectomy    . Colonoscopy w/ polypectomy      Family History  Problem Relation Age of Onset  . Cancer Father     lung  . Hyperlipidemia Sister   . Hypertension Sister   . Colon cancer Neg Hx    Social History   Occupational History  . office supply dealer     retired   Social History Main Topics  . Smoking status: Never Smoker   . Smokeless tobacco: Never Used  . Alcohol Use: No  . Drug Use: No  . Sexual Activity: Not on file   Tobacco Counseling Not applicable  Immunizations and Health Maintenance Immunization History  Administered Date(s) Administered  . Influenza,inj,Quad PF,36+ Mos 12/26/2014  . Pneumococcal Conjugate-13 12/26/2014  . Tdap 01/29/2011  . Zoster 07/07/2013   Colonoscopy due in about 2 years  Activities of Daily Living Patient is able to perform all activities without difficulty.    Advanced Directives: None found. Patient declines at this time.      Assessment:    This is a routine wellness  examination for this patient .   Vision/Hearing screen No exam data present  Dietary issues and exercise activities discussed:     Goals    . Reduce fat intake to X grams per day       Depression Screen PHQ 2/9 Scores 12/26/2014 06/28/2014 03/28/2014  PHQ - 2 Score 0 0 0     Fall Risk Fall Risk  12/26/2014  Falls in the past year? No    MMSE: No flowsheet data found.  Patient Care Team: Claretta Fraise, MD as PCP - General (Family Medicine)     Plan:     During the course of the visit,Luisdaniel was educated and counseled about the following appropriate screening and preventive services:   Vaccines to include Pneumoccal, Influenza, Hepatitis B, Td, Zostavax,  HCV  Electrocardiogram  Cardiovascular Disease  Colorectal cancer screening  Diabetes screening  Glaucoma screening  Nutrition counseling  Prostate cancer screening  Smoking cessation counseling  His current medications and allergies were reviewed and needed refills of his chronic medications were ordered. The plan for yearly health maintenance was discussed all orders and referrals were made as appropriate.  Patient Instructions (the written plan) was given to the patient.   Claretta Fraise, MD 12/27/2014       Assessment & Plan:   Siddhanth was seen today for annual exam, hypertension and hyperlipidemia.  Diagnoses and all orders for this  visit:  Routine general medical examination at a health care facility -     CBC with Differential/Platelet -     CMP14+EGFR -     Lipid panel -     PSA, total and free -     [place] Screening AAA; Future -     EKG 12-Lead  Essential hypertension -     CMP14+EGFR  HLD (hyperlipidemia) -     Lipid panel  Vitamin D deficiency -     Vit D  25 hydroxy (rtn osteoporosis monitoring)  Screening for malignant neoplasm of the rectum -     Fecal occult blood, imunochemical  Other orders -     Pneumococcal conjugate vaccine 13-valent -     Flu Vaccine QUAD 36+ mos IM -     Cholecalciferol (VITAMIN D) 2000 UNITS tablet; Take 1 tablet (2,000 Units total) by mouth daily.   I have discontinued Mr. Galan's ergocalciferol. I am also having him start on Vitamin D. Additionally, I am having him maintain his aspirin, Vitamin D3, Fish Oil, OVER THE COUNTER MEDICATION, lisinopril, amLODipine, and simvastatin.  Meds ordered this encounter  Medications  . Cholecalciferol (VITAMIN D) 2000 UNITS tablet    Sig: Take 1 tablet (2,000 Units total) by mouth daily.    Dispense:  30 tablet    Refill:  11     Follow-up: Return in about 6 months (around 06/25/2015) for hypertension, cholesterol.  Claretta Fraise, M.D.

## 2014-12-26 ENCOUNTER — Ambulatory Visit (INDEPENDENT_AMBULATORY_CARE_PROVIDER_SITE_OTHER): Payer: Commercial Managed Care - HMO | Admitting: Family Medicine

## 2014-12-26 ENCOUNTER — Encounter: Payer: Self-pay | Admitting: Family Medicine

## 2014-12-26 VITALS — BP 136/76 | HR 62 | Temp 97.6°F | Ht 70.0 in | Wt 190.8 lb

## 2014-12-26 DIAGNOSIS — Z23 Encounter for immunization: Secondary | ICD-10-CM | POA: Diagnosis not present

## 2014-12-26 DIAGNOSIS — E559 Vitamin D deficiency, unspecified: Secondary | ICD-10-CM

## 2014-12-26 DIAGNOSIS — Z1212 Encounter for screening for malignant neoplasm of rectum: Secondary | ICD-10-CM

## 2014-12-26 DIAGNOSIS — E785 Hyperlipidemia, unspecified: Secondary | ICD-10-CM | POA: Diagnosis not present

## 2014-12-26 DIAGNOSIS — I10 Essential (primary) hypertension: Secondary | ICD-10-CM

## 2014-12-26 DIAGNOSIS — Z0000 Encounter for general adult medical examination without abnormal findings: Secondary | ICD-10-CM

## 2014-12-26 MED ORDER — VITAMIN D 50 MCG (2000 UT) PO TABS: 2000.0000 [IU] | ORAL_TABLET | Freq: Every day | ORAL | Status: DC

## 2014-12-27 ENCOUNTER — Encounter: Payer: Self-pay | Admitting: Family Medicine

## 2014-12-27 LAB — CBC WITH DIFFERENTIAL/PLATELET
Basophils Absolute: 0.1 10*3/uL (ref 0.0–0.2)
Basos: 1 %
EOS (ABSOLUTE): 0.1 10*3/uL (ref 0.0–0.4)
Eos: 3 %
Hematocrit: 45 % (ref 37.5–51.0)
Hemoglobin: 15 g/dL (ref 12.6–17.7)
Immature Grans (Abs): 0 10*3/uL (ref 0.0–0.1)
Immature Granulocytes: 0 %
Lymphocytes Absolute: 1.5 10*3/uL (ref 0.7–3.1)
Lymphs: 34 %
MCH: 29.9 pg (ref 26.6–33.0)
MCHC: 33.3 g/dL (ref 31.5–35.7)
MCV: 90 fL (ref 79–97)
Monocytes Absolute: 0.5 10*3/uL (ref 0.1–0.9)
Monocytes: 12 %
Neutrophils Absolute: 2.2 10*3/uL (ref 1.4–7.0)
Neutrophils: 50 %
Platelets: 182 10*3/uL (ref 150–379)
RBC: 5.02 x10E6/uL (ref 4.14–5.80)
RDW: 14.5 % (ref 12.3–15.4)
WBC: 4.4 10*3/uL (ref 3.4–10.8)

## 2014-12-27 LAB — LIPID PANEL
Chol/HDL Ratio: 2.2 ratio units (ref 0.0–5.0)
Cholesterol, Total: 166 mg/dL (ref 100–199)
HDL: 77 mg/dL (ref >39)
LDL CALC: 77 mg/dL (ref 0–99)
Triglycerides: 59 mg/dL (ref 0–149)
VLDL Cholesterol Cal: 12 mg/dL (ref 5–40)

## 2014-12-27 LAB — CMP14+EGFR
ALBUMIN: 4.3 g/dL (ref 3.6–4.8)
ALT: 18 IU/L (ref 0–44)
AST: 27 IU/L (ref 0–40)
Albumin/Globulin Ratio: 1.8 (ref 1.1–2.5)
Alkaline Phosphatase: 91 IU/L (ref 39–117)
BILIRUBIN TOTAL: 0.9 mg/dL (ref 0.0–1.2)
BUN/Creatinine Ratio: 20 (ref 10–22)
BUN: 23 mg/dL (ref 8–27)
CALCIUM: 9.5 mg/dL (ref 8.6–10.2)
CHLORIDE: 105 mmol/L (ref 97–108)
CO2: 24 mmol/L (ref 18–29)
CREATININE: 1.17 mg/dL (ref 0.76–1.27)
GFR, EST AFRICAN AMERICAN: 75 mL/min/{1.73_m2} (ref >59)
GFR, EST NON AFRICAN AMERICAN: 65 mL/min/{1.73_m2} (ref >59)
GLUCOSE: 86 mg/dL (ref 65–99)
Globulin, Total: 2.4 g/dL (ref 1.5–4.5)
Potassium: 4.2 mmol/L (ref 3.5–5.2)
Sodium: 147 mmol/L — ABNORMAL HIGH (ref 134–144)
TOTAL PROTEIN: 6.7 g/dL (ref 6.0–8.5)

## 2014-12-27 LAB — PSA, TOTAL AND FREE
PROSTATE SPECIFIC AG, SERUM: 1.2 ng/mL (ref 0.0–4.0)
PSA, Free Pct: 44.2 %
PSA, Free: 0.53 ng/mL

## 2014-12-27 LAB — VITAMIN D 25 HYDROXY (VIT D DEFICIENCY, FRACTURES): Vit D, 25-Hydroxy: 38.9 ng/mL (ref 30.0–100.0)

## 2014-12-28 LAB — FECAL OCCULT BLOOD, IMMUNOCHEMICAL: FECAL OCCULT BLD: NEGATIVE

## 2015-01-01 ENCOUNTER — Encounter: Payer: 59 | Admitting: Family Medicine

## 2015-01-02 ENCOUNTER — Ambulatory Visit (HOSPITAL_COMMUNITY)
Admission: RE | Admit: 2015-01-02 | Discharge: 2015-01-02 | Disposition: A | Discharge status: Home / Self Care | Payer: Commercial Managed Care - HMO | Source: Ambulatory Visit | Attending: Family Medicine | Admitting: Family Medicine

## 2015-01-02 DIAGNOSIS — Z1389 Encounter for screening for other disorder: Secondary | ICD-10-CM | POA: Diagnosis not present

## 2015-01-02 DIAGNOSIS — Z0000 Encounter for general adult medical examination without abnormal findings: Secondary | ICD-10-CM

## 2015-01-12 ENCOUNTER — Other Ambulatory Visit: Payer: Self-pay | Admitting: Family Medicine

## 2015-05-22 ENCOUNTER — Other Ambulatory Visit: Payer: Self-pay | Admitting: Family Medicine

## 2015-05-29 ENCOUNTER — Other Ambulatory Visit: Payer: Self-pay | Admitting: Family Medicine

## 2015-06-20 ENCOUNTER — Other Ambulatory Visit: Payer: Commercial Managed Care - HMO

## 2015-06-20 DIAGNOSIS — I10 Essential (primary) hypertension: Secondary | ICD-10-CM

## 2015-06-20 DIAGNOSIS — E785 Hyperlipidemia, unspecified: Secondary | ICD-10-CM

## 2015-06-21 LAB — CMP14+EGFR
ALK PHOS: 80 IU/L (ref 39–117)
ALT: 17 IU/L (ref 0–44)
AST: 19 IU/L (ref 0–40)
Albumin/Globulin Ratio: 1.8 (ref 1.2–2.2)
Albumin: 4.1 g/dL (ref 3.6–4.8)
BILIRUBIN TOTAL: 1 mg/dL (ref 0.0–1.2)
BUN/Creatinine Ratio: 16 (ref 10–22)
BUN: 19 mg/dL (ref 8–27)
CALCIUM: 8.7 mg/dL (ref 8.6–10.2)
CHLORIDE: 107 mmol/L — AB (ref 96–106)
CO2: 21 mmol/L (ref 18–29)
Creatinine, Ser: 1.16 mg/dL (ref 0.76–1.27)
GFR calc Af Amer: 76 mL/min/{1.73_m2} (ref >59)
GFR calc non Af Amer: 66 mL/min/{1.73_m2} (ref >59)
GLOBULIN, TOTAL: 2.3 g/dL (ref 1.5–4.5)
Glucose: 82 mg/dL (ref 65–99)
Potassium: 3.6 mmol/L (ref 3.5–5.2)
SODIUM: 147 mmol/L — AB (ref 134–144)
Total Protein: 6.4 g/dL (ref 6.0–8.5)

## 2015-06-21 LAB — LIPID PANEL
Chol/HDL Ratio: 2.2 ratio units (ref 0.0–5.0)
Cholesterol, Total: 166 mg/dL (ref 100–199)
HDL: 74 mg/dL (ref >39)
LDL Calculated: 79 mg/dL (ref 0–99)
TRIGLYCERIDES: 64 mg/dL (ref 0–149)
VLDL Cholesterol Cal: 13 mg/dL (ref 5–40)

## 2015-06-21 LAB — CBC WITH DIFFERENTIAL/PLATELET
BASOS ABS: 0 10*3/uL (ref 0.0–0.2)
Basos: 1 %
EOS (ABSOLUTE): 0.3 10*3/uL (ref 0.0–0.4)
Eos: 6 %
HEMOGLOBIN: 15.4 g/dL (ref 12.6–17.7)
Hematocrit: 45.7 % (ref 37.5–51.0)
IMMATURE GRANS (ABS): 0 10*3/uL (ref 0.0–0.1)
Immature Granulocytes: 0 %
LYMPHS: 38 %
Lymphocytes Absolute: 1.6 10*3/uL (ref 0.7–3.1)
MCH: 30.6 pg (ref 26.6–33.0)
MCHC: 33.7 g/dL (ref 31.5–35.7)
MCV: 91 fL (ref 79–97)
Monocytes Absolute: 0.4 10*3/uL (ref 0.1–0.9)
Monocytes: 10 %
NEUTROS ABS: 1.9 10*3/uL (ref 1.4–7.0)
NEUTROS PCT: 45 %
PLATELETS: 162 10*3/uL (ref 150–379)
RBC: 5.03 x10E6/uL (ref 4.14–5.80)
RDW: 13.8 % (ref 12.3–15.4)
WBC: 4.3 10*3/uL (ref 3.4–10.8)

## 2015-06-25 ENCOUNTER — Encounter: Payer: Self-pay | Admitting: Family Medicine

## 2015-06-25 ENCOUNTER — Ambulatory Visit (INDEPENDENT_AMBULATORY_CARE_PROVIDER_SITE_OTHER): Payer: Commercial Managed Care - HMO | Admitting: Family Medicine

## 2015-06-25 VITALS — BP 124/69 | HR 70 | Temp 97.7°F | Ht 70.0 in | Wt 193.0 lb

## 2015-06-25 DIAGNOSIS — E785 Hyperlipidemia, unspecified: Secondary | ICD-10-CM

## 2015-06-25 DIAGNOSIS — E559 Vitamin D deficiency, unspecified: Secondary | ICD-10-CM

## 2015-06-25 DIAGNOSIS — I10 Essential (primary) hypertension: Secondary | ICD-10-CM

## 2015-06-25 MED ORDER — SIMVASTATIN 20 MG PO TABS: ORAL_TABLET | ORAL | Status: DC

## 2015-06-25 MED ORDER — AMLODIPINE BESYLATE 10 MG PO TABS: ORAL_TABLET | ORAL | Status: DC

## 2015-06-25 MED ORDER — LISINOPRIL 20 MG PO TABS: ORAL_TABLET | ORAL | Status: DC

## 2015-06-25 NOTE — Patient Instructions (Signed)
Good Check up!  Keep me posted about Ultrasound billing problem. Thanks for coming in.

## 2015-06-25 NOTE — Addendum Note (Signed)
Addended by: Marin Olp on: 06/25/2015 09:31 AM   Modules accepted: Orders

## 2015-06-25 NOTE — Progress Notes (Signed)
Subjective:  Patient ID: Jeffrey Downs, male    DOB: September 05, 1950  Age: 65 y.o. MRN: [id number]  CC: Hyperlipidemia and Hypertension   HPI Jeffrey Downs presents for  follow-up of hypertension. Patient has no history of headache chest pain or shortness of breath or recent cough. Patient also denies symptoms of TIA such as numbness weakness lateralizing. Patient checks  blood pressure at home and has not had any elevated readings recently. Patient denies side effects from his medication. States taking it regularly.  Patient also  in for follow-up of elevated cholesterol. Doing well without complaints on current medication. Denies side effects of statin including myalgia and arthralgia and nausea. Also in today for liver function testing. Currently no chest pain, shortness of breath or other cardiovascular related symptoms noted.    History Maro has a past medical history of Hypertension; Hyperlipidemia; Kidney stones; and Vitamin D deficiency.   He has past surgical history that includes Tonsillectomy and Colonoscopy w/ polypectomy.   His family history includes Cancer in his father; Hyperlipidemia in his sister; Hypertension in his sister. There is no history of Colon cancer.He reports that he has never smoked. He has never used smokeless tobacco. He reports that he does not drink alcohol or use illicit drugs.  Current Outpatient Prescriptions on File Prior to Visit  Medication Sig Dispense Refill  . amLODipine (NORVASC) 10 MG tablet TAKE 1 TABLET ONE TIME DAILY 90 tablet 0  . aspirin 81 MG chewable tablet Chew 81 mg by mouth daily.    . Cholecalciferol (VITAMIN D) 2000 UNITS tablet Take 1 tablet (2,000 Units total) by mouth daily. 30 tablet 11  . lisinopril (PRINIVIL,ZESTRIL) 20 MG tablet TAKE 1 TABLET ONE TIME DAILY 90 tablet 0  . Omega-3 Fatty Acids (FISH OIL) 1000 MG CAPS Take 1,000 mg by mouth every other day.    Marland Kitchen OVER THE COUNTER MEDICATION Centrum Silver 50+ Mens    .  simvastatin (ZOCOR) 20 MG tablet TAKE 1/2 TABLET EVERY DAY  AT  6:00  PM AS DIRECTED 45 tablet 1   No current facility-administered medications on file prior to visit.    ROS Review of Systems  Constitutional: Negative for fever, chills and diaphoresis.  HENT: Negative for rhinorrhea and sore throat.   Respiratory: Negative for cough and shortness of breath.   Cardiovascular: Negative for chest pain.  Gastrointestinal: Negative for abdominal pain.  Musculoskeletal: Negative for myalgias and arthralgias.  Skin: Negative for rash.  Neurological: Negative for weakness and headaches.    Objective:  BP 124/69 mmHg  Pulse 70  Temp(Src) 97.7 F (36.5 C) (Oral)  Ht 5\' 10"  (1.778 m)  Wt 193 lb (87.544 kg)  BMI 27.69 kg/m2  SpO2 98%  BP Readings from Last 3 Encounters:  06/25/15 124/69  12/26/14 136/76  06/28/14 130/81    Wt Readings from Last 3 Encounters:  06/25/15 193 lb (87.544 kg)  12/26/14 190 lb 12.8 oz (86.546 kg)  06/28/14 193 lb 9.6 oz (87.816 kg)     Physical Exam  Constitutional: He is oriented to person, place, and time. He appears well-developed and well-nourished. No distress.  HENT:  Head: Normocephalic and atraumatic.  Right Ear: External ear normal.  Left Ear: External ear normal.  Nose: Nose normal.  Mouth/Throat: Oropharynx is clear and moist.  Eyes: Conjunctivae and EOM are normal. Pupils are equal, round, and reactive to light.  Neck: Normal range of motion. Neck supple. No thyromegaly present.  Cardiovascular: Normal rate, regular  rhythm and normal heart sounds.   No murmur heard. Pulmonary/Chest: Effort normal and breath sounds normal. No respiratory distress. He has no wheezes. He has no rales.  Abdominal: Soft. Bowel sounds are normal. He exhibits no distension. There is no tenderness.  Lymphadenopathy:    He has no cervical adenopathy.  Neurological: He is alert and oriented to person, place, and time. He has normal reflexes.  Skin: Skin is  warm and dry.  Psychiatric: He has a normal mood and affect. His behavior is normal. Judgment and thought content normal.    No results found for: HGBA1C  Lab Results  Component Value Date   WBC 4.3 06/20/2015   HGB 13.6* 03/28/2014   HCT 45.7 06/20/2015   PLT 162 06/20/2015   GLUCOSE 82 06/20/2015   CHOL 166 06/20/2015   TRIG 64 06/20/2015   HDL 74 06/20/2015   LDLCALC 79 06/20/2015   ALT 17 06/20/2015   AST 19 06/20/2015   NA 147* 06/20/2015   K 3.6 06/20/2015   CL 107* 06/20/2015   CREATININE 1.16 06/20/2015   BUN 19 06/20/2015   CO2 21 06/20/2015   TSH 3.690 03/28/2014   PSA 1.8 03/28/2014    [place] Screening Aaa  01/02/2015  CLINICAL DATA:  Medicare screening exam for abdominal aortic aneurysm. EXAM: ABDOMINAL AORTA SCREENING ULTRASOUND TECHNIQUE: Ultrasound examination of the abdominal aorta was performed as a screening evaluation for abdominal aortic aneurysm. COMPARISON:  CT abdomen and pelvis 07/26/2011 FINDINGS: Abdominal Aorta No aneurysm identified. Maximum Diameter: 2.5 cm in the AP dimension proximally IMPRESSION: No evidence of abdominal aortic aneurysm. Electronically Signed   By: Rolm Baptise M.D.   On: 01/02/2015 09:28    Assessment & Plan:   Darnelle was seen today for hyperlipidemia and hypertension.  Diagnoses and all orders for this visit:  Hyperlipidemia  Essential hypertension  Vitamin D deficiency   I have discontinued Mr. Call's Vitamin D3. I am also having him maintain his aspirin, Fish Oil, OVER THE COUNTER MEDICATION, Vitamin D, amLODipine, lisinopril, and simvastatin.  No orders of the defined types were placed in this encounter.     Follow-up: Return in about 6 months (around 12/26/2015) for CPE.  Claretta Fraise, M.D.

## 2015-12-16 ENCOUNTER — Other Ambulatory Visit: Payer: Self-pay | Admitting: Family Medicine

## 2015-12-17 NOTE — Telephone Encounter (Signed)
Authorize 30 days only. Then contact the patient letting them know that they will need an appointment before any further prescriptions can be sent in. 

## 2015-12-27 ENCOUNTER — Other Ambulatory Visit: Payer: Commercial Managed Care - HMO

## 2015-12-27 DIAGNOSIS — I10 Essential (primary) hypertension: Secondary | ICD-10-CM

## 2015-12-27 DIAGNOSIS — E785 Hyperlipidemia, unspecified: Secondary | ICD-10-CM

## 2015-12-27 DIAGNOSIS — E559 Vitamin D deficiency, unspecified: Secondary | ICD-10-CM

## 2015-12-28 LAB — CBC WITH DIFFERENTIAL/PLATELET
BASOS ABS: 0 10*3/uL (ref 0.0–0.2)
BASOS: 1 %
EOS (ABSOLUTE): 0.2 10*3/uL (ref 0.0–0.4)
Eos: 5 %
HEMOGLOBIN: 13.5 g/dL (ref 12.6–17.7)
Hematocrit: 41.5 % (ref 37.5–51.0)
IMMATURE GRANS (ABS): 0 10*3/uL (ref 0.0–0.1)
IMMATURE GRANULOCYTES: 0 %
LYMPHS: 37 %
Lymphocytes Absolute: 1.5 10*3/uL (ref 0.7–3.1)
MCH: 28.7 pg (ref 26.6–33.0)
MCHC: 32.5 g/dL (ref 31.5–35.7)
MCV: 88 fL (ref 79–97)
MONOCYTES: 9 %
Monocytes Absolute: 0.4 10*3/uL (ref 0.1–0.9)
NEUTROS ABS: 1.9 10*3/uL (ref 1.4–7.0)
NEUTROS PCT: 48 %
PLATELETS: 187 10*3/uL (ref 150–379)
RBC: 4.7 x10E6/uL (ref 4.14–5.80)
RDW: 14.5 % (ref 12.3–15.4)
WBC: 4 10*3/uL (ref 3.4–10.8)

## 2015-12-28 LAB — LIPID PANEL
CHOL/HDL RATIO: 2.4 ratio (ref 0.0–5.0)
CHOLESTEROL TOTAL: 150 mg/dL (ref 100–199)
HDL: 62 mg/dL (ref >39)
LDL CALC: 76 mg/dL (ref 0–99)
TRIGLYCERIDES: 62 mg/dL (ref 0–149)
VLDL Cholesterol Cal: 12 mg/dL (ref 5–40)

## 2015-12-28 LAB — CMP14+EGFR
A/G RATIO: 1.8 (ref 1.2–2.2)
ALBUMIN: 4.1 g/dL (ref 3.6–4.8)
ALT: 13 IU/L (ref 0–44)
AST: 16 IU/L (ref 0–40)
Alkaline Phosphatase: 86 IU/L (ref 39–117)
BILIRUBIN TOTAL: 0.9 mg/dL (ref 0.0–1.2)
BUN / CREAT RATIO: 17 (ref 10–24)
BUN: 19 mg/dL (ref 8–27)
CALCIUM: 9 mg/dL (ref 8.6–10.2)
CHLORIDE: 110 mmol/L — AB (ref 96–106)
CO2: 23 mmol/L (ref 18–29)
Creatinine, Ser: 1.09 mg/dL (ref 0.76–1.27)
GFR, EST AFRICAN AMERICAN: 81 mL/min/{1.73_m2} (ref >59)
GFR, EST NON AFRICAN AMERICAN: 70 mL/min/{1.73_m2} (ref >59)
Globulin, Total: 2.3 g/dL (ref 1.5–4.5)
Glucose: 85 mg/dL (ref 65–99)
Potassium: 3.9 mmol/L (ref 3.5–5.2)
Sodium: 149 mmol/L — ABNORMAL HIGH (ref 134–144)
TOTAL PROTEIN: 6.4 g/dL (ref 6.0–8.5)

## 2015-12-28 LAB — VITAMIN D 25 HYDROXY (VIT D DEFICIENCY, FRACTURES): Vit D, 25-Hydroxy: 42 ng/mL (ref 30.0–100.0)

## 2015-12-30 ENCOUNTER — Encounter: Payer: Self-pay | Admitting: Family Medicine

## 2015-12-30 ENCOUNTER — Ambulatory Visit (INDEPENDENT_AMBULATORY_CARE_PROVIDER_SITE_OTHER): Payer: Commercial Managed Care - HMO | Admitting: Family Medicine

## 2015-12-30 VITALS — BP 116/66 | HR 55 | Temp 97.4°F | Ht 70.0 in | Wt 191.2 lb

## 2015-12-30 DIAGNOSIS — Z125 Encounter for screening for malignant neoplasm of prostate: Secondary | ICD-10-CM

## 2015-12-30 DIAGNOSIS — Z23 Encounter for immunization: Secondary | ICD-10-CM | POA: Diagnosis not present

## 2015-12-30 DIAGNOSIS — Z0000 Encounter for general adult medical examination without abnormal findings: Secondary | ICD-10-CM | POA: Diagnosis not present

## 2015-12-30 DIAGNOSIS — E782 Mixed hyperlipidemia: Secondary | ICD-10-CM

## 2015-12-30 DIAGNOSIS — I10 Essential (primary) hypertension: Secondary | ICD-10-CM

## 2015-12-30 NOTE — Progress Notes (Signed)
Subjective:  Patient ID: Jeffrey Downs, male    DOB: April 12, 1950  Age: 65 y.o. MRN: [id number]  CC: CPE (pt here today for CPE and no concerns are voiced)   HPI Jeffrey Downs presents for  follow-up of hypertension. Patient has no history of headache chest pain or shortness of breath or recent cough. Patient also denies symptoms of TIA such as numbness weakness lateralizing. Patient checks  blood pressure at home and has not had any elevated readings recently. Patient denies side effects from his medication. States taking it regularly.  Patient also  in for follow-up of elevated cholesterol. Doing well without complaints on current medication. Denies side effects of statin including myalgia and arthralgia and nausea. Also in today for liver function testing. Currently no chest pain, shortness of breath or other cardiovascular related symptoms noted.    History Jeffrey Downs has a past medical history of Hyperlipidemia; Hypertension; Kidney stones; and Vitamin D deficiency.   He has a past surgical history that includes Tonsillectomy and Colonoscopy w/ polypectomy.   His family history includes Cancer in his father; Hyperlipidemia in his sister; Hypertension in his sister.He reports that he has never smoked. He has never used smokeless tobacco. He reports that he does not drink alcohol or use drugs.  Current Outpatient Prescriptions on File Prior to Visit  Medication Sig Dispense Refill  . amLODipine (NORVASC) 10 MG tablet TAKE 1 TABLET EVERY DAY 90 tablet 0  . aspirin 81 MG chewable tablet Chew 81 mg by mouth daily.    . Cholecalciferol (VITAMIN D) 2000 UNITS tablet Take 1 tablet (2,000 Units total) by mouth daily. 30 tablet 11  . lisinopril (PRINIVIL,ZESTRIL) 20 MG tablet TAKE 1 TABLET EVERY DAY 90 tablet 1  . Omega-3 Fatty Acids (FISH OIL) 1000 MG CAPS Take 1,000 mg by mouth every other day.    Marland Kitchen OVER THE COUNTER MEDICATION Centrum Silver 50+ Mens    . simvastatin (ZOCOR) 20 MG tablet TAKE  1/2 TABLET EVERY DAY  AT  6:00  PM AS DIRECTED 45 tablet 0   No current facility-administered medications on file prior to visit.     ROS Review of Systems  Constitutional: Negative for activity change, appetite change, chills, diaphoresis, fatigue, fever and unexpected weight change.  HENT: Negative for congestion, ear pain, hearing loss, postnasal drip, rhinorrhea, sore throat, tinnitus and trouble swallowing.   Eyes: Negative for photophobia, pain, discharge and redness.  Respiratory: Negative for apnea, cough, choking, chest tightness, shortness of breath, wheezing and stridor.   Cardiovascular: Negative for chest pain, palpitations and leg swelling.  Gastrointestinal: Negative for abdominal distention, abdominal pain, blood in stool, constipation, diarrhea, nausea and vomiting.  Endocrine: Negative for cold intolerance, heat intolerance, polydipsia, polyphagia and polyuria.  Genitourinary: Negative for difficulty urinating, dysuria, enuresis, flank pain, frequency, genital sores, hematuria and urgency.  Musculoskeletal: Negative for arthralgias and joint swelling.  Skin: Negative for color change, rash and wound.  Allergic/Immunologic: Negative for immunocompromised state.  Neurological: Negative for dizziness, tremors, seizures, syncope, facial asymmetry, speech difficulty, weakness, light-headedness, numbness and headaches.  Hematological: Does not bruise/bleed easily.  Psychiatric/Behavioral: Negative for agitation, behavioral problems, confusion, decreased concentration, dysphoric mood, hallucinations, sleep disturbance and suicidal ideas. The patient is not nervous/anxious and is not hyperactive.     Objective:  BP 116/66   Pulse (!) 55   Temp 97.4 F (36.3 C) (Oral)   Ht 5\' 10"  (1.778 m)   Wt 191 lb 4 oz (86.8 kg)  BMI 27.44 kg/m   BP Readings from Last 3 Encounters:  12/30/15 116/66  06/25/15 124/69  12/26/14 136/76    Wt Readings from Last 3 Encounters:  12/30/15  191 lb 4 oz (86.8 kg)  06/25/15 193 lb (87.5 kg)  12/26/14 190 lb 12.8 oz (86.5 kg)     Physical Exam  Constitutional: He is oriented to person, place, and time. He appears well-developed and well-nourished.  HENT:  Head: Normocephalic and atraumatic.  Mouth/Throat: Oropharynx is clear and moist.  Eyes: EOM are normal. Pupils are equal, round, and reactive to light.  Neck: Normal range of motion. No tracheal deviation present. No thyromegaly present.  Cardiovascular: Normal rate, regular rhythm and normal heart sounds.  Exam reveals no gallop and no friction rub.   No murmur heard. Pulmonary/Chest: Breath sounds normal. He has no wheezes. He has no rales.  Abdominal: Soft. He exhibits no mass. There is no tenderness.  Musculoskeletal: Normal range of motion. He exhibits no edema.  Neurological: He is alert and oriented to person, place, and time.  Skin: Skin is warm and dry.  Psychiatric: He has a normal mood and affect.    No results found for: HGBA1C  Lab Results  Component Value Date   WBC 4.0 12/27/2015   HGB 13.6 (A) 03/28/2014   HCT 41.5 12/27/2015   PLT 187 12/27/2015   GLUCOSE 85 12/27/2015   CHOL 150 12/27/2015   TRIG 62 12/27/2015   HDL 62 12/27/2015   LDLCALC 76 12/27/2015   ALT 13 12/27/2015   AST 16 12/27/2015   NA 149 (H) 12/27/2015   K 3.9 12/27/2015   CL 110 (H) 12/27/2015   CREATININE 1.09 12/27/2015   BUN 19 12/27/2015   CO2 23 12/27/2015   TSH 3.690 03/28/2014   PSA 1.8 03/28/2014    [place] Screening Aaa  Result Date: 01/02/2015 CLINICAL DATA:  Medicare screening exam for abdominal aortic aneurysm. EXAM: ABDOMINAL AORTA SCREENING ULTRASOUND TECHNIQUE: Ultrasound examination of the abdominal aorta was performed as a screening evaluation for abdominal aortic aneurysm. COMPARISON:  CT abdomen and pelvis 07/26/2011 FINDINGS: Abdominal Aorta No aneurysm identified. Maximum Diameter: 2.5 cm in the AP dimension proximally IMPRESSION: No evidence of abdominal  aortic aneurysm. Electronically Signed   By: Rolm Baptise M.D.   On: 01/02/2015 09:28    Assessment & Plan:   Jeffrey Downs was seen today for cpe.  Diagnoses and all orders for this visit:  Essential hypertension  Routine general medical examination at a health care facility  Mixed hyperlipidemia  Screening for prostate cancer -     PSA Total (Reflex To Free)   I am having Jeffrey Downs maintain his aspirin, Fish Oil, OVER THE COUNTER MEDICATION, Vitamin D, amLODipine, simvastatin, and lisinopril.  Labs reviewed at visit. Regular exercise 150 min a week, low fat diet.  Follow-up: Return in about 6 months (around 06/29/2016).  Claretta Fraise, M.D.

## 2016-01-13 NOTE — Addendum Note (Signed)
Addended by: Louann Sjogren A on: 01/13/2016 05:03 PM   Modules accepted: Orders

## 2016-01-27 IMAGING — US US AORTA SCREENING (MEDICARE)
1 series · 14 of 25 positions shown · non-contrast
Comparison: CT abdomen and pelvis 07/26/2011

CLINICAL DATA: Medicare screening exam for abdominal aortic
aneurysm.

EXAM:
ABDOMINAL AORTA SCREENING ULTRASOUND
TECHNIQUE: Ultrasound examination of the abdominal aorta was performed as a
screening evaluation for abdominal aortic aneurysm.

[Series 2: us aorta screening (medicare) · 0.30mm/px · 14 of 27 slices shown]
[im 1/27]
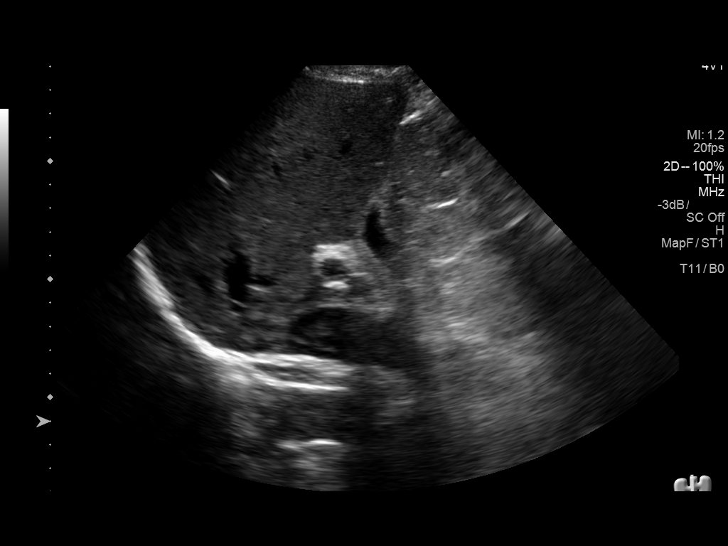
[im 3/27]
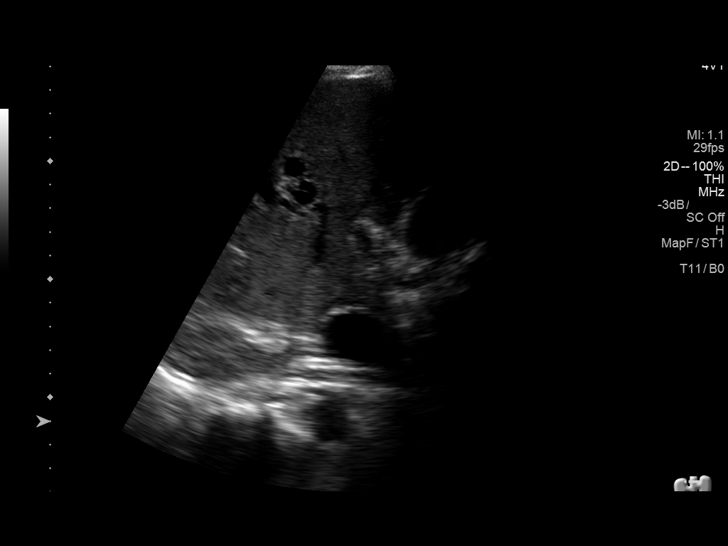
[im 5/27]
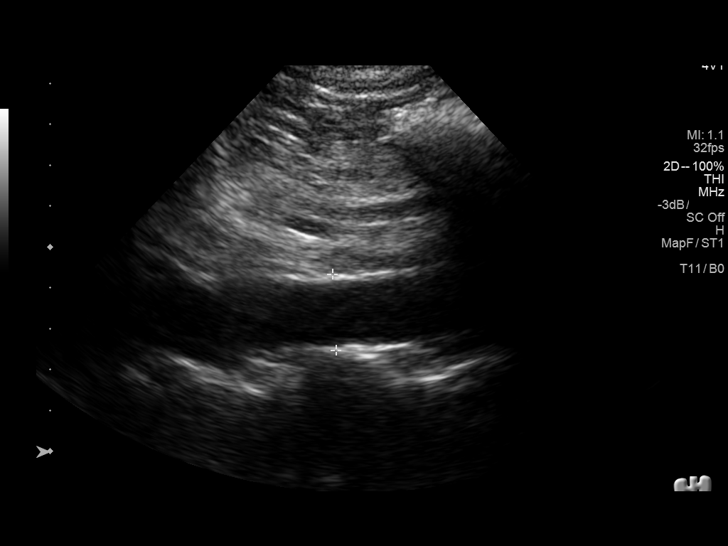
[im 7/27]
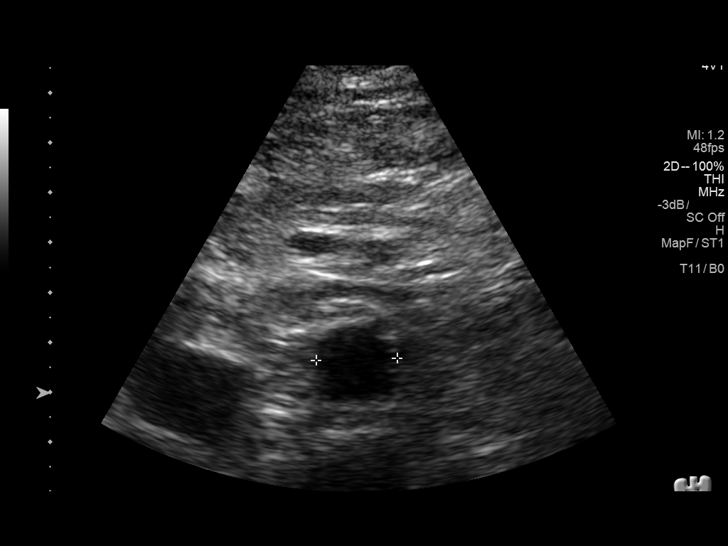
[im 9/27]
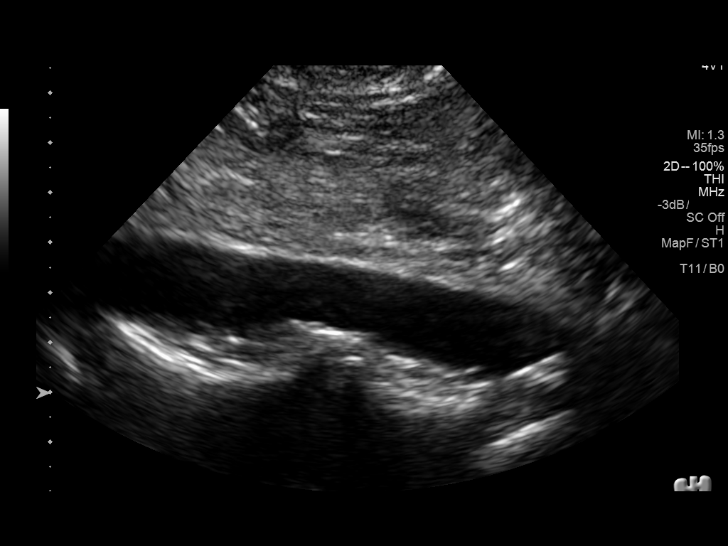
[im 10/27]
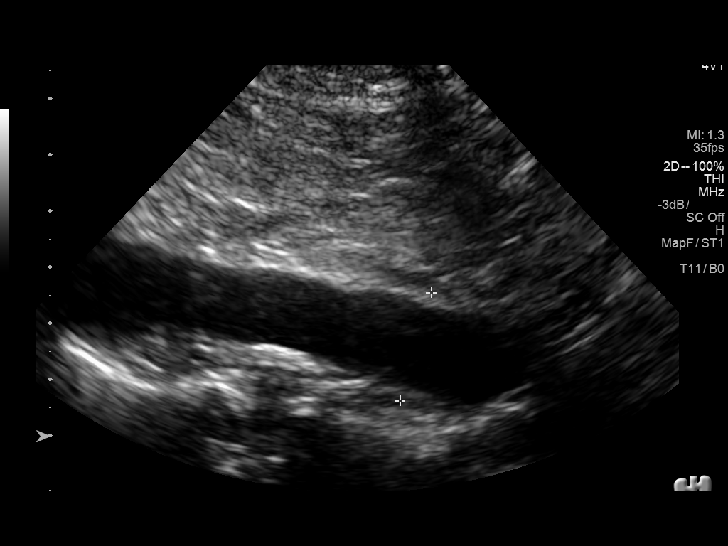
[im 12/27]
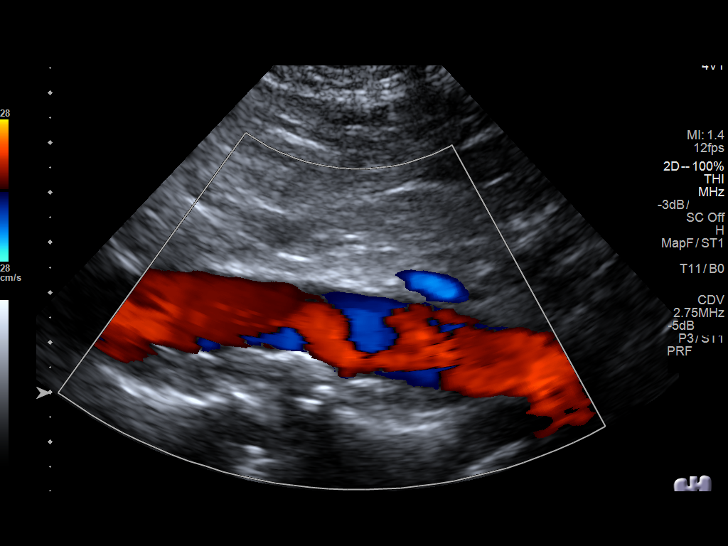
[im 15/27]
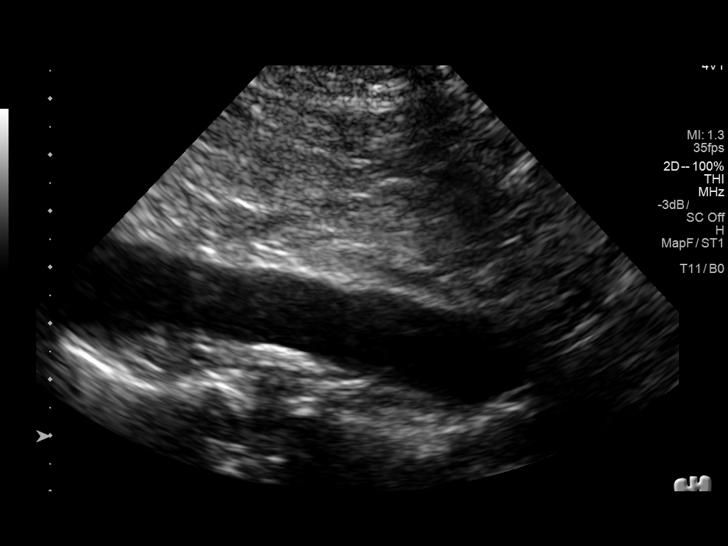
[im 17/27]
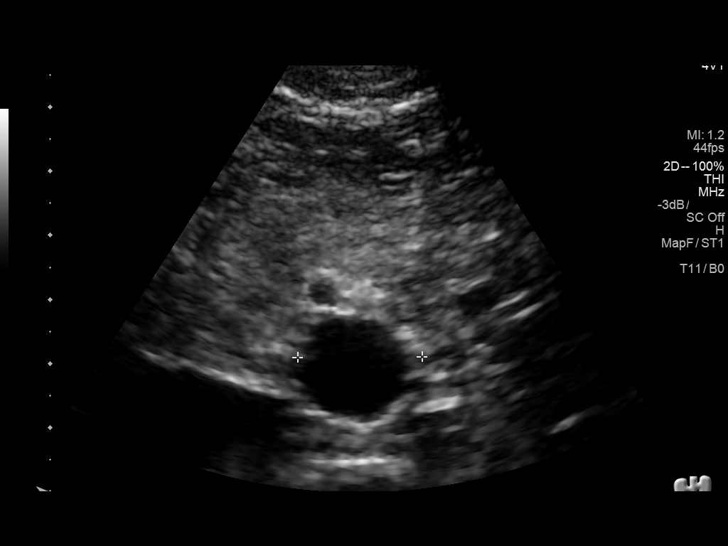
[im 18/27]
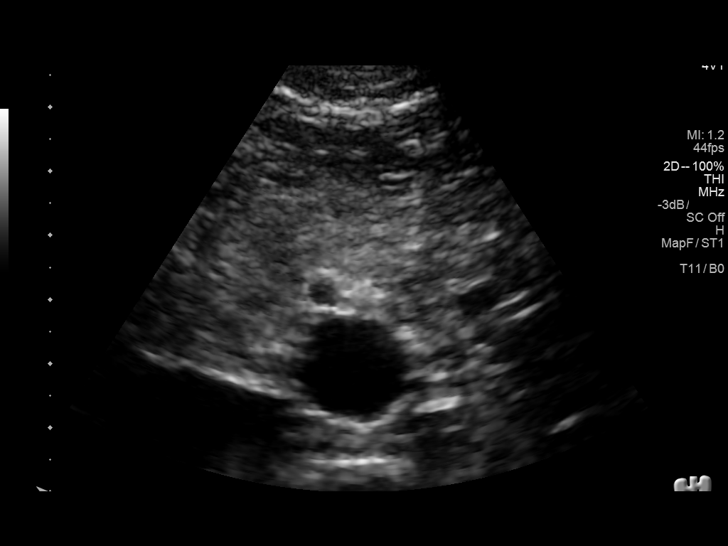
[im 20/27]
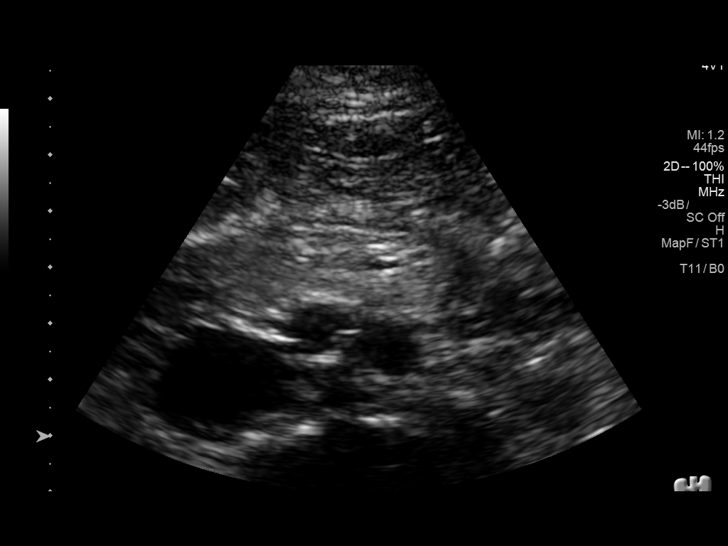
[im 22/27]
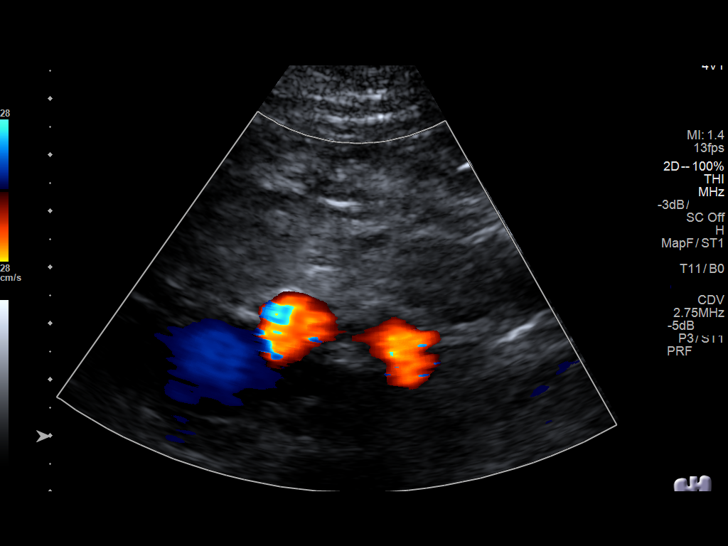
[im 24/27]
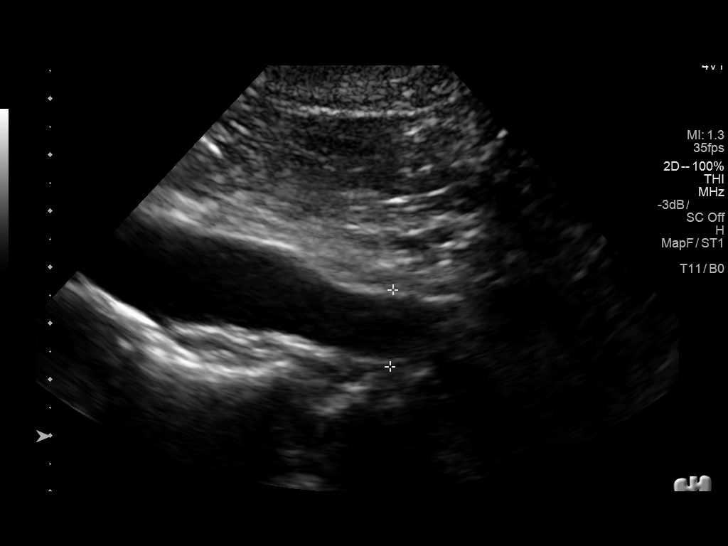
[im 27/27]
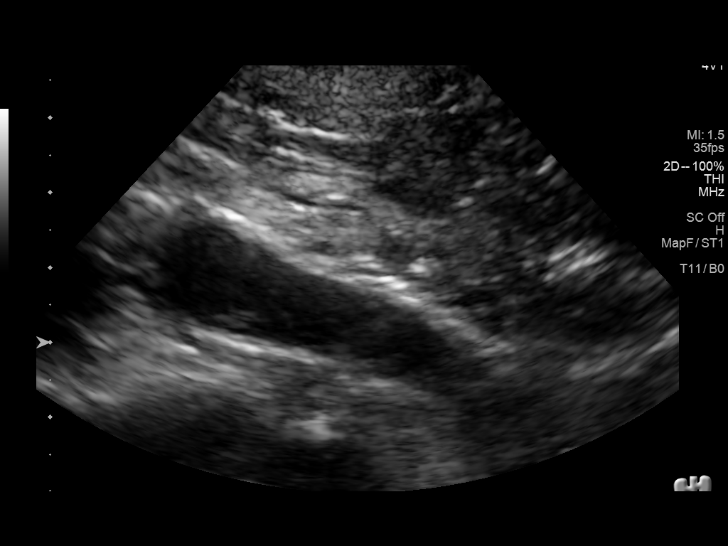

[14 of 25 positions shown; findings below may reference images not displayed]

FINDINGS: Abdominal Aorta

No aneurysm identified.

Maximum Diameter: 2.5 cm in the AP dimension proximally
IMPRESSION: No evidence of abdominal aortic aneurysm.

## 2016-05-04 ENCOUNTER — Other Ambulatory Visit: Payer: Self-pay | Admitting: Family Medicine

## 2016-06-25 ENCOUNTER — Other Ambulatory Visit (INDEPENDENT_AMBULATORY_CARE_PROVIDER_SITE_OTHER): Payer: Medicare PPO

## 2016-06-25 DIAGNOSIS — E782 Mixed hyperlipidemia: Secondary | ICD-10-CM | POA: Diagnosis not present

## 2016-06-25 DIAGNOSIS — E559 Vitamin D deficiency, unspecified: Secondary | ICD-10-CM

## 2016-06-25 DIAGNOSIS — I10 Essential (primary) hypertension: Secondary | ICD-10-CM | POA: Diagnosis not present

## 2016-06-26 LAB — CMP14+EGFR
A/G RATIO: 2.2 (ref 1.2–2.2)
ALK PHOS: 81 IU/L (ref 39–117)
ALT: 18 IU/L (ref 0–44)
AST: 21 IU/L (ref 0–40)
Albumin: 4.3 g/dL (ref 3.6–4.8)
BUN / CREAT RATIO: 16 (ref 10–24)
BUN: 18 mg/dL (ref 8–27)
Bilirubin Total: 0.6 mg/dL (ref 0.0–1.2)
CO2: 26 mmol/L (ref 18–29)
Calcium: 8.9 mg/dL (ref 8.6–10.2)
Chloride: 105 mmol/L (ref 96–106)
Creatinine, Ser: 1.14 mg/dL (ref 0.76–1.27)
GFR calc Af Amer: 77 mL/min/{1.73_m2} (ref >59)
GFR calc non Af Amer: 67 mL/min/{1.73_m2} (ref >59)
GLOBULIN, TOTAL: 2 g/dL (ref 1.5–4.5)
Glucose: 84 mg/dL (ref 65–99)
Potassium: 3.8 mmol/L (ref 3.5–5.2)
Sodium: 145 mmol/L — ABNORMAL HIGH (ref 134–144)
Total Protein: 6.3 g/dL (ref 6.0–8.5)

## 2016-06-26 LAB — LIPID PANEL
CHOL/HDL RATIO: 2.5 ratio (ref 0.0–5.0)
Cholesterol, Total: 180 mg/dL (ref 100–199)
HDL: 71 mg/dL (ref >39)
LDL Calculated: 98 mg/dL (ref 0–99)
Triglycerides: 55 mg/dL (ref 0–149)
VLDL CHOLESTEROL CAL: 11 mg/dL (ref 5–40)

## 2016-06-26 LAB — VITAMIN D 25 HYDROXY (VIT D DEFICIENCY, FRACTURES): VIT D 25 HYDROXY: 40.4 ng/mL (ref 30.0–100.0)

## 2016-06-29 ENCOUNTER — Ambulatory Visit (INDEPENDENT_AMBULATORY_CARE_PROVIDER_SITE_OTHER): Payer: Medicare PPO | Admitting: Family Medicine

## 2016-06-29 ENCOUNTER — Encounter: Payer: Self-pay | Admitting: Family Medicine

## 2016-06-29 VITALS — BP 137/83 | HR 62 | Temp 97.8°F | Ht 70.0 in | Wt 195.0 lb

## 2016-06-29 DIAGNOSIS — M7662 Achilles tendinitis, left leg: Secondary | ICD-10-CM

## 2016-06-29 DIAGNOSIS — E782 Mixed hyperlipidemia: Secondary | ICD-10-CM

## 2016-06-29 DIAGNOSIS — I10 Essential (primary) hypertension: Secondary | ICD-10-CM

## 2016-06-29 NOTE — Progress Notes (Signed)
Subjective:  Patient ID: Jeffrey Downs, male    DOB: [birth year]  Age: 66 y.o. MRN: [id number]  CC: Hypertension (pt here today for routine follow up on his HTN and to go over his labs he had drawn last week.)   HPI Jeffrey Downs presents for  follow-up of hypertension. Patient has no history of headache chest pain or shortness of breath or recent cough. Patient also denies symptoms of TIA such as numbness weakness lateralizing. Patient rarely checks  blood pressure at home. Recent readings have been good Patient denies side effects from medication. States taking it regularly.  Patient also  in for follow-up of elevated cholesterol. Doing well without complaints on current medication. Denies side effects of statin including myalgia and arthralgia and nausea. Also in today for liver function testing. Currently no chest pain, shortness of breath or other cardiovascular related symptoms noted.    History Jeffrey Downs has a past medical history of Hyperlipidemia; Hypertension; Kidney stones; and Vitamin D deficiency.   He has a past surgical history that includes Tonsillectomy and Colonoscopy w/ polypectomy.   His family history includes Cancer in his father; Hyperlipidemia in his sister; Hypertension in his sister.He reports that he has never smoked. He has never used smokeless tobacco. He reports that he does not drink alcohol or use drugs.  Current Outpatient Prescriptions on File Prior to Visit  Medication Sig Dispense Refill  . amLODipine (NORVASC) 10 MG tablet TAKE 1 TABLET EVERY DAY 90 tablet 0  . aspirin 81 MG chewable tablet Chew 81 mg by mouth daily.    . Cholecalciferol (VITAMIN D) 2000 UNITS tablet Take 1 tablet (2,000 Units total) by mouth daily. 30 tablet 11  . lisinopril (PRINIVIL,ZESTRIL) 20 MG tablet TAKE 1 TABLET EVERY DAY 90 tablet 0  . Omega-3 Fatty Acids (FISH OIL) 1000 MG CAPS Take 1,000 mg by mouth every other day.    Marland Kitchen OVER THE COUNTER MEDICATION Centrum Silver 50+ Mens     . simvastatin (ZOCOR) 20 MG tablet TAKE 1/2 TABLET EVERY DAY  AT  6:00  PM AS DIRECTED 45 tablet 0   No current facility-administered medications on file prior to visit.     ROS Review of Systems  Constitutional: Negative for chills, diaphoresis, fever and unexpected weight change.  HENT: Negative for congestion, hearing loss, rhinorrhea and sore throat.   Eyes: Negative for visual disturbance.  Respiratory: Negative for cough and shortness of breath.   Cardiovascular: Negative for chest pain.  Gastrointestinal: Negative for abdominal pain, constipation and diarrhea.  Genitourinary: Negative for dysuria and flank pain.  Musculoskeletal: Positive for arthralgias (pain behind left achilles at insertion). Negative for joint swelling.  Skin: Negative for rash.  Neurological: Negative for dizziness and headaches.  Psychiatric/Behavioral: Negative for dysphoric mood and sleep disturbance.    Objective:  BP (!) 144/86   Pulse 62   Temp 97.8 F (36.6 C) (Oral)   Ht 5\' 10"  (1.778 m)   Wt 195 lb (88.5 kg)   BMI 27.98 kg/m   BP Readings from Last 3 Encounters:  06/29/16 (!) 144/86  12/30/15 116/66  06/25/15 124/69    Wt Readings from Last 3 Encounters:  06/29/16 195 lb (88.5 kg)  12/30/15 191 lb 4 oz (86.8 kg)  06/25/15 193 lb (87.5 kg)     Physical Exam  Constitutional: He is oriented to person, place, and time. He appears well-developed and well-nourished. No distress.  HENT:  Head: Normocephalic and atraumatic.  Right Ear: External  ear normal.  Left Ear: External ear normal.  Nose: Nose normal.  Mouth/Throat: Oropharynx is clear and moist.  Eyes: Conjunctivae and EOM are normal. Pupils are equal, round, and reactive to light.  Neck: Normal range of motion. Neck supple. No thyromegaly present.  Cardiovascular: Normal rate, regular rhythm and normal heart sounds.   No murmur heard. Pulmonary/Chest: Effort normal and breath sounds normal. No respiratory distress. He has  no wheezes. He has no rales.  Abdominal: Soft. Bowel sounds are normal. He exhibits no distension. There is no tenderness.  Musculoskeletal: He exhibits tenderness (mild left Achilles insertion).  Lymphadenopathy:    He has no cervical adenopathy.  Neurological: He is alert and oriented to person, place, and time. He has normal reflexes.  Skin: Skin is warm and dry.  Psychiatric: He has a normal mood and affect. His behavior is normal. Judgment and thought content normal.    No components found for: BAYER DCA HB A1C WAIVED    Assessment & Plan:   Jeffrey Downs was seen today for hypertension.  Diagnoses and all orders for this visit:  Mixed hyperlipidemia  Essential hypertension  Achilles tendinitis of left lower extremity   I am having Jeffrey Downs maintain his aspirin, Fish Oil, OVER THE COUNTER MEDICATION, Vitamin D, amLODipine, simvastatin, and lisinopril.  No orders of the defined types were placed in this encounter.  Check blood pressure at home 3 times in the morning and 3 times in the evening for the next week. If numbers are not less than 140/85 then we should probably get him on a little bit more of this medication.  PSA to be added to today's blood work.  Follow-up: Return in about 6 months (around 12/29/2016) for hypertension, cholesterol, Wellness.  Claretta Fraise, M.D.

## 2016-07-06 ENCOUNTER — Other Ambulatory Visit: Payer: Self-pay | Admitting: Family Medicine

## 2016-07-08 ENCOUNTER — Encounter: Payer: Self-pay | Admitting: Family Medicine

## 2016-07-12 NOTE — Telephone Encounter (Signed)
Please let pt. Know that his BP  Readings looked excellent. Also his PSA will have to be drawn over. I am sorry for the inconvenience. Not sure why it wasn't added on before. WS

## 2016-08-03 ENCOUNTER — Other Ambulatory Visit: Payer: Self-pay | Admitting: Family Medicine

## 2016-09-11 ENCOUNTER — Ambulatory Visit (INDEPENDENT_AMBULATORY_CARE_PROVIDER_SITE_OTHER): Payer: Medicare PPO | Admitting: *Deleted

## 2016-09-11 VITALS — BP 102/61 | HR 65 | Ht 70.0 in | Wt 197.0 lb

## 2016-09-11 DIAGNOSIS — Z0000 Encounter for general adult medical examination without abnormal findings: Secondary | ICD-10-CM

## 2016-09-11 NOTE — Patient Instructions (Addendum)
  Jeffrey Downs , Thank you for taking time to come for your Medicare Wellness Visit. I appreciate your ongoing commitment to your health goals. Please review the following plan we discussed and let me know if I can assist you in the future.   These are the goals we discussed:  Review Advance Directives and bring a signed/notarized copy to our office. Check on the price of Shingrix (shingles vaccine) at your next visit or call our insurance department at [phone] option 6,  at any time.   Goals    . Exercise 150 minutes per week (moderate activity)       This is a list of the screening recommended for you and due dates:  Health Maintenance  Topic Date Due  .  Hepatitis C: One time screening is recommended by Center for Disease Control  (CDC) for  adults born from [birth year] through 1966.   06-Jan-1951  . Flu Shot  10/28/2016  . Colon Cancer Screening  01/10/2019  . Tetanus Vaccine  01/28/2021  . Pneumonia vaccines  Completed

## 2016-09-11 NOTE — Progress Notes (Addendum)
Subjective:   Jeffrey Downs is a 66 y.o. male who presents for an Initial Medicare Annual Wellness Visit. 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 He lives at home with his 36 year old mother. He does not have any children and has not been married. He does not have any pets. He is involved in his church and plays the piano for their services.   Review of Systems  Reports that his health is about the same as last year.  Cardiac Risk Factors include: dyslipidemia;hypertension;male gender;sedentary lifestyle;advanced age (>73men, >55 women)  Other systems negative   Objective:    Today's Vitals   09/11/16 1001  BP: 102/61  Pulse: 65  Weight: 197 lb (89.4 kg)  Height: 5\' 10"  (1.778 m)   Body mass index is 28.27 kg/m.  Current Medications (verified) Outpatient Encounter Prescriptions as of 09/11/2016  Medication Sig  . amLODipine (NORVASC) 10 MG tablet TAKE 1 TABLET EVERY DAY  . aspirin 81 MG chewable tablet Chew 81 mg by mouth daily.  . Cholecalciferol (VITAMIN D) 2000 UNITS tablet Take 1 tablet (2,000 Units total) by mouth daily. (Patient taking differently: Take 4,000 Units by mouth daily. )  . lisinopril (PRINIVIL,ZESTRIL) 20 MG tablet TAKE 1 TABLET EVERY DAY  . Omega-3 Fatty Acids (FISH OIL) 1000 MG CAPS Take 1,000 mg by mouth every other day.  Marland Kitchen OVER THE COUNTER MEDICATION Centrum Silver 50+ Mens  . simvastatin (ZOCOR) 20 MG tablet TAKE 1/2 TABLET EVERY DAY  AT  6:00  PM AS DIRECTED   No facility-administered encounter medications on file as of 09/11/2016.     Allergies (verified) Patient has no known allergies.   History: Past Medical History:  Diagnosis Date  . Hyperlipidemia   . Hypertension   . Kidney stones   . Vitamin D deficiency    Past Surgical History:  Procedure Laterality Date  . COLONOSCOPY W/ POLYPECTOMY    . TONSILLECTOMY     Family History  Problem Relation Age of Onset  . Cancer Father        lung  . Hyperlipidemia Sister   .  Hypertension Sister   . Hypertension Mother   . Thyroid disease Mother   . Colon cancer Neg Hx    Social History   Occupational History  . office supply dealer     retired   Social History Main Topics  . Smoking status: Never Smoker  . Smokeless tobacco: Never Used  . Alcohol use No  . Drug use: No  . Sexual activity: No   Tobacco Counseling No tobacco use  Activities of Daily Living In your present state of health, do you have any difficulty performing the following activities: 09/11/2016  Hearing? N  Vision? N  Difficulty concentrating or making decisions? N  Walking or climbing stairs? N  Dressing or bathing? N  Doing errands, shopping? N  Preparing Food and eating ? N  Using the Toilet? N  In the past six months, have you accidently leaked urine? N  Do you have problems with loss of bowel control? N  Managing your Medications? N  Managing your Finances? N  Housekeeping or managing your Housekeeping? N  Some recent data might be hidden    Immunizations and Health Maintenance Immunization History  Administered Date(s) Administered  . Influenza, High Dose Seasonal PF 12/30/2015  . Influenza,inj,Quad PF,36+ Mos 12/26/2014  . Pneumococcal Conjugate-13 12/26/2014  . Pneumococcal Polysaccharide-23 12/30/2015  . Tdap 01/29/2011  . Zoster 07/07/2013  Health Maintenance Due  Topic Date Due  . Hepatitis C Screening  1950/04/18    Patient Care Team: Claretta Fraise, MD as PCP - General (Family Medicine) Venetia Night Ly, OD (Optometry)   No hospitalizations, ER visits, or surgeries this past year.    Assessment:   This is a routine wellness examination for Jeffrey Downs.   Hearing/Vision screen No hearing or vision deficits noted during visit.  Dietary issues and exercise activities discussed: Current Exercise Habits: Home exercise routine, Time (Minutes): 20, Frequency (Times/Week): 2, Weekly Exercise (Minutes/Week): 40, Intensity: Moderate, Exercise limited by: None  identified   Diet: Eats 3 homecooked meals per day  Goals    . Exercise 150 minutes per week (moderate activity)      Depression Screen PHQ 2/9 Scores 09/11/2016 06/29/2016 12/30/2015 06/25/2015  PHQ - 2 Score 0 0 0 0    Fall Risk Fall Risk  09/11/2016 06/29/2016 12/30/2015 06/25/2015 12/26/2014  Falls in the past year? No No No No No    Cognitive Function: MMSE - Mini Mental State Exam 09/11/2016  Orientation to time 5  Orientation to Place 5  Registration 3  Attention/ Calculation 5  Recall 3  Language- name 2 objects 2  Language- repeat 1  Language- follow 3 step command 3  Language- read & follow direction 1  Write a sentence 1  Copy design 1  Total score 30        Screening Tests Health Maintenance  Topic Date Due  . Hepatitis C Screening  1951/02/04  . INFLUENZA VACCINE  10/28/2016  . COLONOSCOPY  01/10/2019  . TETANUS/TDAP  01/28/2021  . PNA vac Low Risk Adult  Completed   Eye exam 09/2015 Shingrix cost today $170. Patient postponed until later date.     Plan:  Increase activity level to at least 150 minutes of moderate activity weekly Hep C screening per CDC recommendations with next routine labwork Colonoscopy due in 2020 Schedule routine eye exam Check on the cost of Shingrix at next visit or call insurance dept at any time   I have personally reviewed and noted the following in the patient's chart:   . Medical and social history . Use of alcohol, tobacco or illicit drugs  . Current medications and supplements . Functional ability and status . Nutritional status . Physical activity . Advanced directives . List of other physicians . Hospitalizations, surgeries, and ER visits in previous 12 months . Vitals . Screenings to include cognitive, depression, and falls . Referrals and appointments  In addition, I have reviewed and discussed with patient certain preventive protocols, quality metrics, and best practice recommendations. A written personalized  care plan for preventive services as well as general preventive health recommendations were provided to patient.                              Chong Sicilian, RN   09/11/2016   I have reviewed and agree with the above AWV documentation.  Claretta Fraise, M.D.

## 2016-11-23 ENCOUNTER — Other Ambulatory Visit: Payer: Self-pay | Admitting: Family Medicine

## 2016-12-21 ENCOUNTER — Other Ambulatory Visit: Payer: Self-pay | Admitting: Family Medicine

## 2016-12-22 NOTE — Telephone Encounter (Signed)
Next OV 12/29/16

## 2016-12-25 ENCOUNTER — Other Ambulatory Visit: Payer: Medicare PPO

## 2016-12-25 DIAGNOSIS — E782 Mixed hyperlipidemia: Secondary | ICD-10-CM | POA: Diagnosis not present

## 2016-12-25 DIAGNOSIS — I10 Essential (primary) hypertension: Secondary | ICD-10-CM | POA: Diagnosis not present

## 2016-12-25 DIAGNOSIS — E559 Vitamin D deficiency, unspecified: Secondary | ICD-10-CM

## 2016-12-25 DIAGNOSIS — Z125 Encounter for screening for malignant neoplasm of prostate: Secondary | ICD-10-CM | POA: Diagnosis not present

## 2016-12-26 LAB — CMP14+EGFR
ALT: 14 IU/L (ref 0–44)
AST: 21 IU/L (ref 0–40)
Albumin/Globulin Ratio: 1.9 (ref 1.2–2.2)
Albumin: 4 g/dL (ref 3.6–4.8)
Alkaline Phosphatase: 80 IU/L (ref 39–117)
BUN/Creatinine Ratio: 13 (ref 10–24)
BUN: 14 mg/dL (ref 8–27)
Bilirubin Total: 0.8 mg/dL (ref 0.0–1.2)
CALCIUM: 8.7 mg/dL (ref 8.6–10.2)
CO2: 24 mmol/L (ref 20–29)
CREATININE: 1.11 mg/dL (ref 0.76–1.27)
Chloride: 107 mmol/L — ABNORMAL HIGH (ref 96–106)
GFR, EST AFRICAN AMERICAN: 79 mL/min/{1.73_m2} (ref >59)
GFR, EST NON AFRICAN AMERICAN: 68 mL/min/{1.73_m2} (ref >59)
Globulin, Total: 2.1 g/dL (ref 1.5–4.5)
Glucose: 86 mg/dL (ref 65–99)
Potassium: 3.6 mmol/L (ref 3.5–5.2)
Sodium: 145 mmol/L — ABNORMAL HIGH (ref 134–144)
TOTAL PROTEIN: 6.1 g/dL (ref 6.0–8.5)

## 2016-12-26 LAB — PSA, TOTAL AND FREE
PSA, Free Pct: 34.3 %
PSA, Free: 0.48 ng/mL
Prostate Specific Ag, Serum: 1.4 ng/mL (ref 0.0–4.0)

## 2016-12-26 LAB — LIPID PANEL
CHOL/HDL RATIO: 2.6 ratio (ref 0.0–5.0)
Cholesterol, Total: 160 mg/dL (ref 100–199)
HDL: 62 mg/dL (ref >39)
LDL CALC: 81 mg/dL (ref 0–99)
TRIGLYCERIDES: 85 mg/dL (ref 0–149)
VLDL CHOLESTEROL CAL: 17 mg/dL (ref 5–40)

## 2016-12-26 LAB — VITAMIN D 25 HYDROXY (VIT D DEFICIENCY, FRACTURES): Vit D, 25-Hydroxy: 39.2 ng/mL (ref 30.0–100.0)

## 2016-12-29 ENCOUNTER — Ambulatory Visit (INDEPENDENT_AMBULATORY_CARE_PROVIDER_SITE_OTHER): Payer: Medicare PPO | Admitting: Family Medicine

## 2016-12-29 ENCOUNTER — Encounter: Payer: Self-pay | Admitting: Family Medicine

## 2016-12-29 VITALS — BP 107/64 | HR 60 | Temp 97.1°F | Ht 70.0 in | Wt 192.0 lb

## 2016-12-29 DIAGNOSIS — E782 Mixed hyperlipidemia: Secondary | ICD-10-CM

## 2016-12-29 DIAGNOSIS — I10 Essential (primary) hypertension: Secondary | ICD-10-CM

## 2016-12-29 DIAGNOSIS — E559 Vitamin D deficiency, unspecified: Secondary | ICD-10-CM

## 2016-12-29 DIAGNOSIS — Z23 Encounter for immunization: Secondary | ICD-10-CM | POA: Diagnosis not present

## 2016-12-29 MED ORDER — VITAMIN D 50 MCG (2000 UT) PO TABS: 4000.0000 [IU] | ORAL_TABLET | Freq: Every day | ORAL | 11 refills | Status: DC

## 2016-12-29 NOTE — Progress Notes (Signed)
Subjective:  Patient ID: Jeffrey Downs, male    DOB: [birth year]  Age: 66 y.o. MRN: [id number]  CC: Hypertension (pt here today for routine follow up of his chronic medical conditions, no other concerns voiced.)   HPI Jeffrey Downs presents for  follow-up of hypertension. Patient has no history of headache chest pain or shortness of breath or recent cough. Patient also denies symptoms of TIA such as focal numbness or weakness. Patient denies side effects from medication. States taking it regularly.   History Jeffrey Downs has a past medical history of Hyperlipidemia; Hypertension; Kidney stones; and Vitamin D deficiency.   He has a past surgical history that includes Tonsillectomy and Colonoscopy w/ polypectomy.   His family history includes Cancer in his father; Hyperlipidemia in his sister; Hypertension in his mother and sister; Thyroid disease in his mother.He reports that he has never smoked. He has never used smokeless tobacco. He reports that he does not drink alcohol or use drugs.  Current Outpatient Prescriptions on File Prior to Visit  Medication Sig Dispense Refill  . amLODipine (NORVASC) 10 MG tablet TAKE 1 TABLET EVERY DAY 90 tablet 0  . aspirin 81 MG chewable tablet Chew 81 mg by mouth daily.    Marland Kitchen lisinopril (PRINIVIL,ZESTRIL) 20 MG tablet TAKE 1 TABLET EVERY DAY 90 tablet 0  . Omega-3 Fatty Acids (FISH OIL) 1000 MG CAPS Take 1,000 mg by mouth every other day.    Marland Kitchen OVER THE COUNTER MEDICATION Centrum Silver 50+ Mens    . simvastatin (ZOCOR) 20 MG tablet TAKE 1/2 TABLET EVERY DAY  AT  6:00  PM AS DIRECTED 45 tablet 0   No current facility-administered medications on file prior to visit.     ROS Review of Systems  Constitutional: Negative for chills, diaphoresis, fever and unexpected weight change.  HENT: Negative for congestion, hearing loss, rhinorrhea and sore throat.   Eyes: Negative for visual disturbance.  Respiratory: Negative for cough and shortness of breath.     Cardiovascular: Negative for chest pain.  Gastrointestinal: Negative for abdominal pain, constipation and diarrhea.  Genitourinary: Negative for dysuria and flank pain.  Musculoskeletal: Negative for arthralgias and joint swelling.  Skin: Negative for rash.  Neurological: Negative for dizziness and headaches.  Psychiatric/Behavioral: Negative for dysphoric mood and sleep disturbance.    Objective:  BP 107/64   Pulse 60   Temp (!) 97.1 F (36.2 C) (Oral)   Ht 5' 10"  (1.778 m)   Wt 192 lb (87.1 kg)   BMI 27.55 kg/m   BP Readings from Last 3 Encounters:  12/29/16 107/64  09/11/16 102/61  06/29/16 137/83    Wt Readings from Last 3 Encounters:  12/29/16 192 lb (87.1 kg)  09/11/16 197 lb (89.4 kg)  06/29/16 195 lb (88.5 kg)     Physical Exam  Constitutional: He is oriented to person, place, and time. He appears well-developed and well-nourished. No distress.  HENT:  Head: Normocephalic and atraumatic.  Right Ear: External ear normal.  Left Ear: External ear normal.  Nose: Nose normal.  Mouth/Throat: Oropharynx is clear and moist.  Eyes: Pupils are equal, round, and reactive to light. Conjunctivae and EOM are normal.  Neck: Normal range of motion. Neck supple. No thyromegaly present.  Cardiovascular: Normal rate, regular rhythm and normal heart sounds.   No murmur heard. Pulmonary/Chest: Effort normal and breath sounds normal. No respiratory distress. He has no wheezes. He has no rales.  Abdominal: Soft. Bowel sounds are normal. He exhibits no  distension. There is no tenderness.  Lymphadenopathy:    He has no cervical adenopathy.  Neurological: He is alert and oriented to person, place, and time. He has normal reflexes.  Skin: Skin is warm and dry.  Psychiatric: He has a normal mood and affect. His behavior is normal. Judgment and thought content normal.    Results for orders placed or performed in visit on 12/25/16  CMP14+EGFR  Result Value Ref Range   Glucose 86  65 - 99 mg/dL   BUN 14 8 - 27 mg/dL   Creatinine, Ser 1.11 0.76 - 1.27 mg/dL   GFR calc non Af Amer 68 >59 mL/min/1.73   GFR calc Af Amer 79 >59 mL/min/1.73   BUN/Creatinine Ratio 13 10 - 24   Sodium 145 (H) 134 - 144 mmol/L   Potassium 3.6 3.5 - 5.2 mmol/L   Chloride 107 (H) 96 - 106 mmol/L   CO2 24 20 - 29 mmol/L   Calcium 8.7 8.6 - 10.2 mg/dL   Total Protein 6.1 6.0 - 8.5 g/dL   Albumin 4.0 3.6 - 4.8 g/dL   Globulin, Total 2.1 1.5 - 4.5 g/dL   Albumin/Globulin Ratio 1.9 1.2 - 2.2   Bilirubin Total 0.8 0.0 - 1.2 mg/dL   Alkaline Phosphatase 80 39 - 117 IU/L   AST 21 0 - 40 IU/L   ALT 14 0 - 44 IU/L  Lipid panel  Result Value Ref Range   Cholesterol, Total 160 100 - 199 mg/dL   Triglycerides 85 0 - 149 mg/dL   HDL 62 >39 mg/dL   VLDL Cholesterol Cal 17 5 - 40 mg/dL   LDL Calculated 81 0 - 99 mg/dL   Chol/HDL Ratio 2.6 0.0 - 5.0 ratio  PSA, total and free  Result Value Ref Range   Prostate Specific Ag, Serum 1.4 0.0 - 4.0 ng/mL   PSA, Free 0.48 N/A ng/mL   PSA, Free Pct 34.3 %  VITAMIN D 25 Hydroxy (Vit-D Deficiency, Fractures)  Result Value Ref Range   Vit D, 25-Hydroxy 39.2 30.0 - 100.0 ng/mL     Assessment & Plan:   Jeffrey Downs was seen today for hypertension.  Diagnoses and all orders for this visit:  Essential hypertension  Mixed hyperlipidemia  Vitamin D deficiency -     Cholecalciferol (VITAMIN D) 2000 units tablet; Take 2 tablets (4,000 Units total) by mouth daily.   Allergies as of 12/29/2016   No Known Allergies     Medication List       Accurate as of 12/29/16  9:01 AM. Always use your most recent med list.          amLODipine 10 MG tablet Commonly known as:  NORVASC TAKE 1 TABLET EVERY DAY   aspirin 81 MG chewable tablet Chew 81 mg by mouth daily.   Fish Oil 1000 MG Caps Take 1,000 mg by mouth every other day.   lisinopril 20 MG tablet Commonly known as:  PRINIVIL,ZESTRIL TAKE 1 TABLET EVERY DAY   OVER THE COUNTER MEDICATION Centrum  Silver 50+ Mens   simvastatin 20 MG tablet Commonly known as:  ZOCOR TAKE 1/2 TABLET EVERY DAY  AT  6:00  PM AS DIRECTED   Vitamin D 2000 units tablet Take 2 tablets (4,000 Units total) by mouth daily.       Meds ordered this encounter  Medications  . Cholecalciferol (VITAMIN D) 2000 units tablet    Sig: Take 2 tablets (4,000 Units total) by mouth daily.  Dispense:  60 tablet    Refill:  11    all diagnoses are stable. With his regular checkup being normal today he should continueMeds and treatments as is.  Follow-up: Return in about 6 months (around 06/29/2017).  Claretta Fraise, M.D.

## 2017-02-01 ENCOUNTER — Other Ambulatory Visit: Payer: Self-pay | Admitting: Family Medicine

## 2017-02-22 ENCOUNTER — Other Ambulatory Visit: Payer: Self-pay | Admitting: Family Medicine

## 2017-04-28 ENCOUNTER — Other Ambulatory Visit: Payer: Self-pay | Admitting: Family Medicine

## 2017-06-21 ENCOUNTER — Other Ambulatory Visit: Payer: Self-pay | Admitting: Family Medicine

## 2017-06-29 ENCOUNTER — Encounter: Payer: Self-pay | Admitting: Family Medicine

## 2017-06-29 ENCOUNTER — Ambulatory Visit: Payer: Medicare PPO | Admitting: Family Medicine

## 2017-06-29 VITALS — BP 129/66 | HR 62 | Temp 96.7°F | Ht 70.0 in | Wt 195.1 lb

## 2017-06-29 DIAGNOSIS — I10 Essential (primary) hypertension: Secondary | ICD-10-CM

## 2017-06-29 DIAGNOSIS — E782 Mixed hyperlipidemia: Secondary | ICD-10-CM | POA: Diagnosis not present

## 2017-06-29 MED ORDER — AMLODIPINE BESYLATE 10 MG PO TABS: 10.0000 mg | ORAL_TABLET | Freq: Every day | ORAL | 1 refills | Status: DC

## 2017-06-29 MED ORDER — SIMVASTATIN 20 MG PO TABS: ORAL_TABLET | ORAL | 1 refills | Status: DC

## 2017-06-29 MED ORDER — LISINOPRIL 20 MG PO TABS: 20.0000 mg | ORAL_TABLET | Freq: Every day | ORAL | 1 refills | Status: DC

## 2017-06-29 NOTE — Patient Instructions (Signed)

## 2017-06-29 NOTE — Progress Notes (Signed)
Subjective:  Patient ID: Jeffrey Downs, male    DOB: 04-Jul-1950  Age: 67 y.o. MRN: [id number]  CC: Hypertension (pt here today for routine follow up of his chronic medical conditions)   HPI Jeffrey Downs presents for  follow-up of hypertension. Patient has no history of headache chest pain or shortness of breath or recent cough. Patient also denies symptoms of TIA such as focal numbness or weakness.  Patient denies side effects from medication. States taking it regularly. Patient in for follow-up of elevated cholesterol. Doing well without complaints on current medication. Denies side effects of statin including myalgia and arthralgia and nausea. Also in today for liver function testing. Currently no chest pain, shortness of breath or other cardiovascular related symptoms noted.  History Jeffrey Downs has a past medical history of Hyperlipidemia, Hypertension, Kidney stones, and Vitamin D deficiency.   He has a past surgical history that includes Tonsillectomy and Colonoscopy w/ polypectomy.   His family history includes Cancer in his father; Hyperlipidemia in his sister; Hypertension in his mother and sister; Thyroid disease in his mother.He reports that he has never smoked. He has never used smokeless tobacco. He reports that he does not drink alcohol or use drugs.  Current Outpatient Medications on File Prior to Visit  Medication Sig Dispense Refill  . aspirin 81 MG chewable tablet Chew 81 mg by mouth daily.    . Cholecalciferol (VITAMIN D) 2000 units tablet Take 2 tablets (4,000 Units total) by mouth daily. 60 tablet 11  . Omega-3 Fatty Acids (FISH OIL) 1000 MG CAPS Take 1,000 mg by mouth every other day.    Marland Kitchen OVER THE COUNTER MEDICATION Centrum Silver 50+ Mens     No current facility-administered medications on file prior to visit.     ROS Review of Systems  Constitutional: Negative.   HENT: Negative.   Eyes: Negative for visual disturbance.  Respiratory: Negative for cough and  shortness of breath.   Cardiovascular: Negative for chest pain and leg swelling.  Gastrointestinal: Negative for abdominal pain, diarrhea, nausea and vomiting.  Genitourinary: Negative for difficulty urinating.  Musculoskeletal: Positive for arthralgias (His knees get stiff  when walking on stairs). Negative for myalgias.  Skin: Negative for rash.  Neurological: Negative for headaches.  Psychiatric/Behavioral: Negative for sleep disturbance.    Objective:  BP 129/66   Pulse 62   Temp (!) 96.7 F (35.9 C) (Oral)   Ht 5' 10"  (1.778 m)   Wt 195 lb 2 oz (88.5 kg)   BMI 28.00 kg/m   BP Readings from Last 3 Encounters:  06/29/17 129/66  12/29/16 107/64  09/11/16 102/61    Wt Readings from Last 3 Encounters:  06/29/17 195 lb 2 oz (88.5 kg)  12/29/16 192 lb (87.1 kg)  09/11/16 197 lb (89.4 kg)     Physical Exam  Constitutional: He is oriented to person, place, and time. He appears well-developed and well-nourished. No distress.  HENT:  Head: Normocephalic and atraumatic.  Right Ear: External ear normal.  Left Ear: External ear normal.  Nose: Nose normal.  Mouth/Throat: Oropharynx is clear and moist.  Eyes: Pupils are equal, round, and reactive to light. Conjunctivae and EOM are normal.  Neck: Normal range of motion. Neck supple. No thyromegaly present.  Cardiovascular: Normal rate, regular rhythm and normal heart sounds.  No murmur heard. Pulmonary/Chest: Effort normal and breath sounds normal. No respiratory distress. He has no wheezes. He has no rales.  Abdominal: Soft. Bowel sounds are normal. He exhibits  no distension. There is no tenderness.  Lymphadenopathy:    He has no cervical adenopathy.  Neurological: He is alert and oriented to person, place, and time. He has normal reflexes.  Skin: Skin is warm and dry.  Psychiatric: He has a normal mood and affect. His behavior is normal. Judgment and thought content normal.      Assessment & Plan:   Jeffrey Downs was seen  today for hypertension.  Diagnoses and all orders for this visit:  Essential hypertension  Mixed hyperlipidemia -     CBC with Differential/Platelet -     CMP14+EGFR -     Lipid panel  Other orders -     amLODipine (NORVASC) 10 MG tablet; Take 1 tablet (10 mg total) by mouth daily. -     lisinopril (PRINIVIL,ZESTRIL) 20 MG tablet; Take 1 tablet (20 mg total) by mouth daily. -     simvastatin (ZOCOR) 20 MG tablet; TAKE 1/2 TABLET EVERY DAY  AT  6:00PM AS DIRECTED   Allergies as of 06/29/2017   No Known Allergies     Medication List        Accurate as of 06/29/17 11:59 PM. Always use your most recent med list.          amLODipine 10 MG tablet Commonly known as:  NORVASC Take 1 tablet (10 mg total) by mouth daily.   aspirin 81 MG chewable tablet Chew 81 mg by mouth daily.   Fish Oil 1000 MG Caps Take 1,000 mg by mouth every other day.   lisinopril 20 MG tablet Commonly known as:  PRINIVIL,ZESTRIL Take 1 tablet (20 mg total) by mouth daily.   OVER THE COUNTER MEDICATION Centrum Silver 50+ Mens   simvastatin 20 MG tablet Commonly known as:  ZOCOR TAKE 1/2 TABLET EVERY DAY  AT  6:00PM AS DIRECTED   Vitamin D 2000 units tablet Take 2 tablets (4,000 Units total) by mouth daily.       Meds ordered this encounter  Medications  . amLODipine (NORVASC) 10 MG tablet    Sig: Take 1 tablet (10 mg total) by mouth daily.    Dispense:  90 tablet    Refill:  1  . lisinopril (PRINIVIL,ZESTRIL) 20 MG tablet    Sig: Take 1 tablet (20 mg total) by mouth daily.    Dispense:  90 tablet    Refill:  1  . simvastatin (ZOCOR) 20 MG tablet    Sig: TAKE 1/2 TABLET EVERY DAY  AT  6:00PM AS DIRECTED    Dispense:  45 tablet    Refill:  1    Although he has some arthritic changes he does not desire specific treatment at this point.  He is willing to try some Tylenol at home at 3 g daily maximum as reviewed patient in for follow-up of elevated cholesterol. Doing well without complaints  on current medication. Denies side effects of statin including myalgia and arthralgia and nausea. Also in today for liver function testing. Currently no chest pain, shortness of breath or other cardiovascular related symptoms noted.  Follow-up: Return in about 6 months (around 12/29/2017).  Claretta Fraise, M.D.

## 2017-06-30 LAB — CMP14+EGFR
A/G RATIO: 2.1 (ref 1.2–2.2)
ALK PHOS: 80 IU/L (ref 39–117)
ALT: 16 IU/L (ref 0–44)
AST: 21 IU/L (ref 0–40)
Albumin: 4.2 g/dL (ref 3.6–4.8)
BUN/Creatinine Ratio: 14 (ref 10–24)
BUN: 15 mg/dL (ref 8–27)
Bilirubin Total: 0.9 mg/dL (ref 0.0–1.2)
CO2: 24 mmol/L (ref 20–29)
CREATININE: 1.05 mg/dL (ref 0.76–1.27)
Calcium: 9 mg/dL (ref 8.6–10.2)
Chloride: 107 mmol/L — ABNORMAL HIGH (ref 96–106)
GFR calc Af Amer: 84 mL/min/{1.73_m2} (ref >59)
GFR calc non Af Amer: 73 mL/min/{1.73_m2} (ref >59)
GLOBULIN, TOTAL: 2 g/dL (ref 1.5–4.5)
Glucose: 84 mg/dL (ref 65–99)
POTASSIUM: 3.6 mmol/L (ref 3.5–5.2)
SODIUM: 144 mmol/L (ref 134–144)
Total Protein: 6.2 g/dL (ref 6.0–8.5)

## 2017-06-30 LAB — CBC WITH DIFFERENTIAL/PLATELET
Basophils Absolute: 0 10*3/uL (ref 0.0–0.2)
Basos: 1 %
EOS (ABSOLUTE): 0.2 10*3/uL (ref 0.0–0.4)
EOS: 5 %
HEMATOCRIT: 42.8 % (ref 37.5–51.0)
Hemoglobin: 14.4 g/dL (ref 13.0–17.7)
Immature Grans (Abs): 0 10*3/uL (ref 0.0–0.1)
Immature Granulocytes: 0 %
LYMPHS ABS: 1.3 10*3/uL (ref 0.7–3.1)
Lymphs: 34 %
MCH: 29.7 pg (ref 26.6–33.0)
MCHC: 33.6 g/dL (ref 31.5–35.7)
MCV: 88 fL (ref 79–97)
MONOS ABS: 0.5 10*3/uL (ref 0.1–0.9)
Monocytes: 12 %
Neutrophils Absolute: 1.9 10*3/uL (ref 1.4–7.0)
Neutrophils: 48 %
Platelets: 162 10*3/uL (ref 150–379)
RBC: 4.85 x10E6/uL (ref 4.14–5.80)
RDW: 14.9 % (ref 12.3–15.4)
WBC: 3.9 10*3/uL (ref 3.4–10.8)

## 2017-06-30 LAB — LIPID PANEL
CHOLESTEROL TOTAL: 166 mg/dL (ref 100–199)
Chol/HDL Ratio: 2.3 ratio (ref 0.0–5.0)
HDL: 71 mg/dL (ref >39)
LDL Calculated: 82 mg/dL (ref 0–99)
TRIGLYCERIDES: 63 mg/dL (ref 0–149)
VLDL Cholesterol Cal: 13 mg/dL (ref 5–40)

## 2017-09-02 ENCOUNTER — Encounter: Payer: Self-pay | Admitting: *Deleted

## 2017-10-27 ENCOUNTER — Other Ambulatory Visit: Payer: Self-pay | Admitting: Family Medicine

## 2017-10-28 NOTE — Telephone Encounter (Signed)
Last Vit D 12/25/16  39.2 Dt Stacks

## 2017-11-03 ENCOUNTER — Other Ambulatory Visit: Payer: Self-pay | Admitting: *Deleted

## 2017-11-03 MED ORDER — CHOLECALCIFEROL 50 MCG (2000 UT) PO CAPS: ORAL_CAPSULE | ORAL | 0 refills | Status: DC

## 2017-11-03 NOTE — Telephone Encounter (Signed)
Rx for the D3 2000 units was printed, resent to [facility]

## 2017-11-08 ENCOUNTER — Other Ambulatory Visit: Payer: Self-pay | Admitting: Family Medicine

## 2017-11-10 DIAGNOSIS — E559 Vitamin D deficiency, unspecified: Secondary | ICD-10-CM | POA: Diagnosis not present

## 2017-11-10 DIAGNOSIS — E663 Overweight: Secondary | ICD-10-CM | POA: Diagnosis not present

## 2017-11-10 DIAGNOSIS — I10 Essential (primary) hypertension: Secondary | ICD-10-CM | POA: Diagnosis not present

## 2017-11-10 DIAGNOSIS — Z6828 Body mass index (BMI) 28.0-28.9, adult: Secondary | ICD-10-CM | POA: Diagnosis not present

## 2017-11-10 DIAGNOSIS — E785 Hyperlipidemia, unspecified: Secondary | ICD-10-CM | POA: Diagnosis not present

## 2017-12-29 ENCOUNTER — Ambulatory Visit: Payer: Medicare PPO | Admitting: Family Medicine

## 2017-12-30 ENCOUNTER — Encounter: Payer: Self-pay | Admitting: Family Medicine

## 2017-12-30 ENCOUNTER — Ambulatory Visit (INDEPENDENT_AMBULATORY_CARE_PROVIDER_SITE_OTHER): Payer: Medicare PPO | Admitting: Family Medicine

## 2017-12-30 VITALS — BP 129/78 | HR 56 | Temp 97.1°F | Ht 70.0 in | Wt 193.0 lb

## 2017-12-30 DIAGNOSIS — E782 Mixed hyperlipidemia: Secondary | ICD-10-CM

## 2017-12-30 DIAGNOSIS — I10 Essential (primary) hypertension: Secondary | ICD-10-CM

## 2017-12-30 DIAGNOSIS — Z23 Encounter for immunization: Secondary | ICD-10-CM

## 2017-12-30 DIAGNOSIS — Z125 Encounter for screening for malignant neoplasm of prostate: Secondary | ICD-10-CM

## 2017-12-30 MED ORDER — AMLODIPINE BESYLATE 10 MG PO TABS: 10.0000 mg | ORAL_TABLET | Freq: Every day | ORAL | 1 refills | Status: DC

## 2017-12-30 MED ORDER — SIMVASTATIN 20 MG PO TABS: 10.0000 mg | ORAL_TABLET | Freq: Every day | ORAL | 1 refills | Status: DC

## 2017-12-30 MED ORDER — LISINOPRIL 20 MG PO TABS: 20.0000 mg | ORAL_TABLET | Freq: Every day | ORAL | 1 refills | Status: DC

## 2017-12-30 MED ORDER — CHOLECALCIFEROL 50 MCG (2000 UT) PO CAPS: ORAL_CAPSULE | ORAL | 1 refills | Status: AC

## 2017-12-30 NOTE — Progress Notes (Signed)
Subjective:  Patient ID: Jeffrey Downs,  male    DOB: 1950-07-15  Age: 67 y.o.    CC: Follow-up   HPI Jeffrey Downs presents for  follow-up of hypertension. Patient has no history of headache chest pain or shortness of breath or recent cough. Patient also denies symptoms of TIA such as numbness weakness lateralizing. Patient denies side effects from medication. States taking it regularly.  Patient also  in for follow-up of elevated cholesterol. Doing well without complaints on current medication. Denies side effects  including myalgia and arthralgia and nausea. Also in today for liver function testing. Currently no chest pain, shortness of breath or other cardiovascular related symptoms noted.   History Jeffrey Downs has a past medical history of Hyperlipidemia, Hypertension, Kidney stones, and Vitamin D deficiency.   Jeffrey Downs has a past surgical history that includes Tonsillectomy and Colonoscopy w/ polypectomy.   His family history includes Cancer in his father; Hyperlipidemia in his sister; Hypertension in his mother and sister; Thyroid disease in his mother.Jeffrey Downs reports that Jeffrey Downs has never smoked. Jeffrey Downs has never used smokeless tobacco. Jeffrey Downs reports that Jeffrey Downs does not drink alcohol or use drugs.  Current Outpatient Medications on File Prior to Visit  Medication Sig Dispense Refill  . aspirin 81 MG chewable tablet Chew 81 mg by mouth daily.    . Omega-3 Fatty Acids (FISH OIL) 1000 MG CAPS Take 1,000 mg by mouth every other day.    Marland Kitchen OVER THE COUNTER MEDICATION Centrum Silver 50+ Mens     No current facility-administered medications on file prior to visit.     ROS Review of Systems  Constitutional: Negative.   HENT: Negative.   Eyes: Negative for visual disturbance.  Respiratory: Negative for cough and shortness of breath.   Cardiovascular: Negative for chest pain and leg swelling.  Gastrointestinal: Negative for abdominal pain, diarrhea, nausea and vomiting.  Genitourinary: Negative for  difficulty urinating.  Musculoskeletal: Negative for arthralgias and myalgias.  Skin: Negative for rash.  Neurological: Negative for headaches.  Psychiatric/Behavioral: Negative for sleep disturbance.    Objective:  BP 129/78   Pulse (!) 56   Temp (!) 97.1 F (36.2 C)   Ht 5' 10"  (1.778 m)   Wt 193 lb (87.5 kg)   BMI 27.69 kg/m   BP Readings from Last 3 Encounters:  12/30/17 129/78  06/29/17 129/66  12/29/16 107/64    Wt Readings from Last 3 Encounters:  12/30/17 193 lb (87.5 kg)  06/29/17 195 lb 2 oz (88.5 kg)  12/29/16 192 lb (87.1 kg)     Physical Exam  Constitutional: Jeffrey Downs is oriented to person, place, and time. Jeffrey Downs appears well-developed and well-nourished. No distress.  HENT:  Head: Normocephalic and atraumatic.  Right Ear: External ear normal.  Left Ear: External ear normal.  Nose: Nose normal.  Mouth/Throat: Oropharynx is clear and moist.  Eyes: Pupils are equal, round, and reactive to light. Conjunctivae and EOM are normal.  Neck: Normal range of motion. Neck supple.  Cardiovascular: Normal rate, regular rhythm and normal heart sounds.  No murmur heard. Pulmonary/Chest: Effort normal and breath sounds normal. No respiratory distress. Jeffrey Downs has no wheezes. Jeffrey Downs has no rales.  Abdominal: Soft. There is no tenderness.  Musculoskeletal: Normal range of motion.  Neurological: Jeffrey Downs is alert and oriented to person, place, and time. Jeffrey Downs has normal reflexes.  Skin: Skin is warm and dry.  Psychiatric: Jeffrey Downs has a normal mood and affect. His behavior is normal. Judgment and thought content normal.  Assessment & Plan:   Jeffrey Downs was seen today for follow-up.  Diagnoses and all orders for this visit:  Essential hypertension -     CBC -     CMP14+EGFR -     Lipid Panel  Encounter for immunization -     Flu vaccine HIGH DOSE PF  Mixed hyperlipidemia -     CBC -     CMP14+EGFR -     Lipid Panel  Prostate cancer screening -     PSA  Other orders -     amLODipine  (NORVASC) 10 MG tablet; Take 1 tablet (10 mg total) by mouth daily. -     Cholecalciferol (D3 SUPER STRENGTH) 2000 units CAPS; TAKE 2 CAPSULES  (4000  UNITS) EVERY DAY -     lisinopril (PRINIVIL,ZESTRIL) 20 MG tablet; Take 1 tablet (20 mg total) by mouth daily. -     simvastatin (ZOCOR) 20 MG tablet; Take 0.5 tablets (10 mg total) by mouth daily at 6 PM.   I have changed Jeffrey Downs's simvastatin. I am also having him maintain his aspirin, Fish Oil, OVER THE COUNTER MEDICATION, amLODipine, Cholecalciferol, and lisinopril.  Meds ordered this encounter  Medications  . amLODipine (NORVASC) 10 MG tablet    Sig: Take 1 tablet (10 mg total) by mouth daily.    Dispense:  90 tablet    Refill:  1  . Cholecalciferol (D3 SUPER STRENGTH) 2000 units CAPS    Sig: TAKE 2 CAPSULES  (4000  UNITS) EVERY DAY    Dispense:  180 capsule    Refill:  1  . lisinopril (PRINIVIL,ZESTRIL) 20 MG tablet    Sig: Take 1 tablet (20 mg total) by mouth daily.    Dispense:  90 tablet    Refill:  1  . simvastatin (ZOCOR) 20 MG tablet    Sig: Take 0.5 tablets (10 mg total) by mouth daily at 6 PM.    Dispense:  45 tablet    Refill:  1     Follow-up: Return in about 6 months (around 07/01/2018).  Claretta Fraise, M.D.

## 2017-12-31 LAB — LIPID PANEL
CHOLESTEROL TOTAL: 167 mg/dL (ref 100–199)
Chol/HDL Ratio: 2.5 ratio (ref 0.0–5.0)
HDL: 66 mg/dL (ref >39)
LDL Calculated: 87 mg/dL (ref 0–99)
Triglycerides: 71 mg/dL (ref 0–149)
VLDL CHOLESTEROL CAL: 14 mg/dL (ref 5–40)

## 2017-12-31 LAB — CMP14+EGFR
ALBUMIN: 4.2 g/dL (ref 3.6–4.8)
ALK PHOS: 82 IU/L (ref 39–117)
ALT: 16 IU/L (ref 0–44)
AST: 18 IU/L (ref 0–40)
Albumin/Globulin Ratio: 2 (ref 1.2–2.2)
BILIRUBIN TOTAL: 0.9 mg/dL (ref 0.0–1.2)
BUN / CREAT RATIO: 15 (ref 10–24)
BUN: 18 mg/dL (ref 8–27)
CHLORIDE: 107 mmol/L — AB (ref 96–106)
CO2: 24 mmol/L (ref 20–29)
Calcium: 9 mg/dL (ref 8.6–10.2)
Creatinine, Ser: 1.2 mg/dL (ref 0.76–1.27)
GFR calc non Af Amer: 62 mL/min/{1.73_m2} (ref >59)
GFR, EST AFRICAN AMERICAN: 71 mL/min/{1.73_m2} (ref >59)
GLUCOSE: 92 mg/dL (ref 65–99)
Globulin, Total: 2.1 g/dL (ref 1.5–4.5)
POTASSIUM: 3.8 mmol/L (ref 3.5–5.2)
Sodium: 145 mmol/L — ABNORMAL HIGH (ref 134–144)
TOTAL PROTEIN: 6.3 g/dL (ref 6.0–8.5)

## 2017-12-31 LAB — CBC
HEMOGLOBIN: 14.6 g/dL (ref 13.0–17.7)
Hematocrit: 45.2 % (ref 37.5–51.0)
MCH: 28.7 pg (ref 26.6–33.0)
MCHC: 32.3 g/dL (ref 31.5–35.7)
MCV: 89 fL (ref 79–97)
PLATELETS: 162 10*3/uL (ref 150–450)
RBC: 5.09 x10E6/uL (ref 4.14–5.80)
RDW: 13.8 % (ref 12.3–15.4)
WBC: 3.9 10*3/uL (ref 3.4–10.8)

## 2017-12-31 LAB — PSA: PROSTATE SPECIFIC AG, SERUM: 1.2 ng/mL (ref 0.0–4.0)

## 2018-02-03 DIAGNOSIS — H52222 Regular astigmatism, left eye: Secondary | ICD-10-CM | POA: Diagnosis not present

## 2018-02-03 DIAGNOSIS — H5213 Myopia, bilateral: Secondary | ICD-10-CM | POA: Diagnosis not present

## 2018-02-03 DIAGNOSIS — H43811 Vitreous degeneration, right eye: Secondary | ICD-10-CM | POA: Diagnosis not present

## 2018-02-03 DIAGNOSIS — H43393 Other vitreous opacities, bilateral: Secondary | ICD-10-CM | POA: Diagnosis not present

## 2018-05-23 ENCOUNTER — Telehealth: Payer: Self-pay | Admitting: Family Medicine

## 2018-07-01 ENCOUNTER — Encounter: Payer: Medicare PPO | Admitting: Family Medicine

## 2018-07-05 ENCOUNTER — Other Ambulatory Visit: Payer: Self-pay | Admitting: Family Medicine

## 2018-07-28 ENCOUNTER — Encounter: Payer: Medicare PPO | Admitting: *Deleted

## 2018-08-26 ENCOUNTER — Other Ambulatory Visit: Payer: Self-pay | Admitting: Family Medicine

## 2018-08-26 NOTE — Telephone Encounter (Signed)
Stacks. NTBS for 6 mos ckup that was to be April. Refill sent

## 2018-08-26 NOTE — Telephone Encounter (Signed)
Appointment made for patient.

## 2018-08-29 ENCOUNTER — Other Ambulatory Visit: Payer: Self-pay | Admitting: Family Medicine

## 2018-08-29 NOTE — Telephone Encounter (Signed)
Patient has appointment 6/2

## 2018-08-30 ENCOUNTER — Encounter: Payer: Self-pay | Admitting: Family Medicine

## 2018-08-30 ENCOUNTER — Ambulatory Visit (INDEPENDENT_AMBULATORY_CARE_PROVIDER_SITE_OTHER): Payer: Medicare PPO | Admitting: Family Medicine

## 2018-08-30 ENCOUNTER — Other Ambulatory Visit: Payer: Self-pay

## 2018-08-30 DIAGNOSIS — E782 Mixed hyperlipidemia: Secondary | ICD-10-CM

## 2018-08-30 DIAGNOSIS — I10 Essential (primary) hypertension: Secondary | ICD-10-CM

## 2018-08-30 NOTE — Progress Notes (Signed)
    Subjective:    Patient ID: Jeffrey Downs, male    DOB: [birth year], 68 y.o.   MRN: [id number]   HPI: Jeffrey Downs is a 67 y.o. male presenting for follow up of hypertension. Doesn't check at home, but has a monitor. He checked it three time in the course of today's visit with the following results:  152/90, 142/91 and  149/81   Taken at home during phone visit.  Denies side effects of meds. Taking regularly. No cardiovascular symptoms including dyspnea and chest pains.   Also follow up of cholesterol. Denies  Arthralgia, myalgia. Long term use of zoloft. Reviewed previous tests. Numbers stable at goal for 2 years, so blood testing today deferred.   Depression screen [facility] 2/9 12/30/2017 06/29/2017 12/29/2016 09/11/2016 06/29/2016  Decreased Interest 0 0 0 0 0  Down, Depressed, Hopeless - 0 0 0 0  PHQ - 2 Score 0 0 0 0 0     Relevant past medical, surgical, family and social history reviewed and updated as indicated.  Interim medical history since our last visit reviewed. Allergies and medications reviewed and updated.  ROS:  Review of Systems  Constitutional: Negative.   HENT: Negative.   Eyes: Negative for visual disturbance.  Respiratory: Negative for cough and shortness of breath.   Cardiovascular: Negative for chest pain and leg swelling.  Gastrointestinal: Negative for abdominal pain, diarrhea, nausea and vomiting.  Genitourinary: Negative for difficulty urinating.  Musculoskeletal: Negative for arthralgias and myalgias.  Skin: Negative for rash.  Neurological: Negative for headaches.  Psychiatric/Behavioral: Negative for sleep disturbance.     Social History   Tobacco Use  Smoking Status Never Smoker  Smokeless Tobacco Never Used       Objective:     Wt Readings from Last 3 Encounters:  12/30/17 193 lb (87.5 kg)  06/29/17 195 lb 2 oz (88.5 kg)  12/29/16 192 lb (87.1 kg)     Exam deferred. Pt. Harboring due to COVID 19. Phone visit performed.    Assessment & Plan:   1. Essential hypertension   2. Mixed hyperlipidemia     No orders of the defined types were placed in this encounter.   No orders of the defined types were placed in this encounter.     Diagnoses and all orders for this visit:  Essential hypertension  Mixed hyperlipidemia    Virtual Visit via telephone Note  I discussed the limitations, risks, security and privacy concerns of performing an evaluation and management service by telephone and the availability of in person appointments. The patient was identified with two identifiers. Pt.expressed understanding and agreed to proceed. Pt. Is at home. Dr. Livia Snellen is in his office.  Follow Up Instructions:   I discussed the assessment and treatment plan with the patient. The patient was provided an opportunity to ask questions and all were answered. The patient agreed with the plan and demonstrated an understanding of the instructions.   The patient was advised to call back or seek an in-person evaluation if the symptoms worsen or if the condition fails to improve as anticipated.   Total minutes including chart review and phone contact time: 26   Follow up plan: Return in about 6 months (around 03/01/2019) for Wellness.  Claretta Fraise, MD [facility]

## 2018-09-06 ENCOUNTER — Encounter: Payer: Self-pay | Admitting: Family Medicine

## 2018-09-12 ENCOUNTER — Telehealth: Payer: Self-pay | Admitting: Family Medicine

## 2018-10-05 ENCOUNTER — Ambulatory Visit (INDEPENDENT_AMBULATORY_CARE_PROVIDER_SITE_OTHER): Payer: Medicare PPO | Admitting: *Deleted

## 2018-10-05 DIAGNOSIS — Z0000 Encounter for general adult medical examination without abnormal findings: Secondary | ICD-10-CM | POA: Diagnosis not present

## 2018-10-05 NOTE — Progress Notes (Signed)
MEDICARE ANNUAL WELLNESS VISIT  10/05/2018  Telephone Visit Disclaimer This Medicare AWV was conducted by telephone due to national recommendations for restrictions regarding the COVID-19 Pandemic (e.g. social distancing).  I verified, using two identifiers, that I am speaking with Jeffrey Downs or their authorized healthcare agent. I discussed the limitations, risks, security, and privacy concerns of performing an evaluation and management service by telephone and the potential availability of an in-person appointment in the future. The patient expressed understanding and agreed to proceed.   Subjective:  Jeffrey Downs is a 68 y.o. male patient of Stacks, Jeffrey Gash, MD who had a Medicare Annual Wellness Visit today via telephone. Jeffrey Downs is Retired and lives with their mother. he has 0 children. he reports that he is socially active and does interact with friends/family regularly. he is moderately physically active and enjoys playing the piano.  Patient Care Team: Claretta Fraise, MD as PCP - General (Family Medicine) Melina Schools, OD (Optometry)  Advanced Directives 10/05/2018 09/11/2016 01/09/2014 12/26/2013  Does Patient Have a Medical Advance Directive? No No No No  Would patient like information on creating a medical advance directive? No - Patient declined Yes (MAU/Ambulatory/Procedural Areas - Information given) No - patient declined information No - patient declined information    Hospital Utilization Over the Past 12 Months: # of hospitalizations or ER visits: 0 # of surgeries: 0  Review of Systems    Patient reports that his overall health is unchanged compared to last year.  Patient Reported Readings (BP, Pulse, CBG, Weight, etc) none  Review of Systems: No complaints  All other systems negative.  Pain Assessment Pain : No/denies pain     Current Medications & Allergies (verified) Allergies as of 10/05/2018   No Known Allergies     Medication List       Accurate as of October 05, 2018  2:33 PM. If you have any questions, ask your nurse or doctor.        amLODipine 10 MG tablet Commonly known as: NORVASC Take 1 tablet (10 mg total) by mouth daily.   aspirin 81 MG chewable tablet Chew 81 mg by mouth daily.   Cholecalciferol 50 MCG (2000 UT) Caps Commonly known as: D3 Super Strength TAKE 2 CAPSULES  (4000  UNITS) EVERY DAY   Fish Oil 1000 MG Caps Take 1,000 mg by mouth every other day.   lisinopril 20 MG tablet Commonly known as: ZESTRIL TAKE 1 TABLET EVERY DAY (NEED MD APPOINTMENT)   OVER THE COUNTER MEDICATION Centrum Silver 50+ Mens   simvastatin 20 MG tablet Commonly known as: ZOCOR TAKE 1/2 TABLET EVERY DAY AT 6 PM       History (reviewed): Past Medical History:  Diagnosis Date  . Hyperlipidemia   . Hypertension   . Kidney stones   . Vitamin D deficiency    Past Surgical History:  Procedure Laterality Date  . COLONOSCOPY W/ POLYPECTOMY    . TONSILLECTOMY     Family History  Problem Relation Age of Onset  . Cancer Father        lung  . Hyperlipidemia Sister   . Hypertension Sister   . Hypertension Mother   . Thyroid disease Mother   . Colon cancer Neg Hx    Social History   Socioeconomic History  . Marital status: Single    Spouse name: Not on file  . Number of children: 0  . Years of education: 16  . Highest education level: Bachelor's  degree (e.g., BA, AB, BS)  Occupational History  . Occupation: Mudlogger    Comment: retired  Scientific laboratory technician  . Financial resource strain: Not hard at all  . Food insecurity    Worry: Never true    Inability: Never true  . Transportation needs    Medical: No    Non-medical: No  Tobacco Use  . Smoking status: Never Smoker  . Smokeless tobacco: Never Used  Substance and Sexual Activity  . Alcohol use: No  . Drug use: No  . Sexual activity: Yes    Birth control/protection: Condom  Lifestyle  . Physical activity    Days per week: 3 days    Minutes  per session: 60 min  . Stress: Not at all  Relationships  . Social connections    Talks on phone: More than three times a week    Gets together: More than three times a week    Attends religious service: More than 4 times per year    Active member of club or organization: Yes    Attends meetings of clubs or organizations: More than 4 times per year    Relationship status: Never married  Other Topics Concern  . Not on file  Social History Narrative   Single   No children   No pets     Activities of Daily Living In your present state of health, do you have any difficulty performing the following activities: 10/05/2018  Hearing? N  Vision? N  Difficulty concentrating or making decisions? N  Walking or climbing stairs? N  Dressing or bathing? N  Doing errands, shopping? N  Preparing Food and eating ? N  Using the Toilet? N  In the past six months, have you accidently leaked urine? N  Do you have problems with loss of bowel control? N  Managing your Medications? N  Managing your Finances? N  Housekeeping or managing your Housekeeping? N  Some recent data might be hidden    Patient Literacy How often do you need to have someone help you when you read instructions, pamphlets, or other written materials from your doctor or pharmacy?: 1 - Never What is the last grade level you completed in school?: Bachelors Degree  Exercise Current Exercise Habits: Home exercise routine, Type of exercise: strength training/weights(yard work), Time (Minutes): 60, Frequency (Times/Week): 3, Weekly Exercise (Minutes/Week): 180, Intensity: Moderate, Exercise limited by: None identified  Diet Patient reports consuming 3 meals a day and 1 snack(s) a day Patient reports that his primary diet is: Regular Patient reports that she does have regular access to food.   Depression Screen PHQ 2/9 Scores 10/05/2018 12/30/2017 06/29/2017 12/29/2016 09/11/2016 06/29/2016 12/30/2015  PHQ - 2 Score 0 0 0 0 0 0 0      Fall Risk Fall Risk  10/05/2018 12/30/2017 12/29/2016 09/11/2016 06/29/2016  Falls in the past year? 0 No No No No     Objective:  Jeffrey Downs seemed alert and oriented and he participated appropriately during our telephone visit.  Blood Pressure Weight BMI  BP Readings from Last 3 Encounters:  12/30/17 129/78  06/29/17 129/66  12/29/16 107/64   Wt Readings from Last 3 Encounters:  12/30/17 193 lb (87.5 kg)  06/29/17 195 lb 2 oz (88.5 kg)  12/29/16 192 lb (87.1 kg)   BMI Readings from Last 1 Encounters:  12/30/17 27.69 kg/m    *Unable to obtain current vital signs, weight, and BMI due to telephone visit type  Hearing/Vision  .  Jeffrey Downs did not seem to have difficulty with hearing/understanding during the telephone conversation . Reports that he has not had a formal eye exam by an eye care professional within the past year . Reports that he has not had a formal hearing evaluation within the past year *Unable to fully assess hearing and vision during telephone visit type  Cognitive Function: 6CIT Screen 10/05/2018  What Year? 0 points  What month? 0 points  What time? 0 points  Count back from 20 0 points  Months in reverse 0 points  Repeat phrase 0 points  Total Score 0   (Normal:0-7, Significant for Dysfunction: >8)  Normal Cognitive Function Screening: Yes   Immunization & Health Maintenance Record Immunization History  Administered Date(s) Administered  . Influenza, High Dose Seasonal PF 12/30/2015, 12/30/2017  . Influenza,inj,Quad PF,6+ Mos 12/26/2014, 12/29/2016  . Pneumococcal Conjugate-13 12/26/2014  . Pneumococcal Polysaccharide-23 12/30/2015  . Tdap 01/29/2011  . Zoster 07/07/2013    Health Maintenance  Topic Date Due  . Hepatitis C Screening  1950/12/22  . INFLUENZA VACCINE  10/29/2018  . COLONOSCOPY  01/10/2019  . TETANUS/TDAP  01/28/2021  . PNA vac Low Risk Adult  Completed       Assessment  This is a routine wellness examination for Jeffrey Downs.  Health Maintenance: Due or Overdue Health Maintenance Due  Topic Date Due  . Hepatitis C Screening  08-16-1950    Jeffrey Downs does not need a referral for Community Assistance: Care Management:   no Social Work:    no Prescription Assistance:  no Nutrition/Diabetes Education:  no   Plan:  Personalized Goals Goals Addressed            This Visit's Progress   . DIET - INCREASE WATER INTAKE       Try to drink 6-8 glasses of water daily.      Personalized Health Maintenance & Screening Recommendations  Advanced directives: has NO advanced directive - not interested in additional information  Lung Cancer Screening Recommended: no (Low Dose CT Chest recommended if Age 41-80 years, 30 pack-year currently smoking OR have quit w/in past 15 years) Hepatitis C Screening recommended: yes HIV Screening recommended: no  Advanced Directives: Written information was not prepared per patient's request.  Referrals & Orders No orders of the defined types were placed in this encounter.   Follow-up Plan . Follow-up with Claretta Fraise, MD as planned . Consider Hep C screening at your next visit with your PCP   I have personally reviewed and noted the following in the patient's chart:   . Medical and social history . Use of alcohol, tobacco or illicit drugs  . Current medications and supplements . Functional ability and status . Nutritional status . Physical activity . Advanced directives . List of other physicians . Hospitalizations, surgeries, and ER visits in previous 12 months . Vitals . Screenings to include cognitive, depression, and falls . Referrals and appointments  In addition, I have reviewed and discussed with Jeffrey Downs certain preventive protocols, quality metrics, and best practice recommendations. A written personalized care plan for preventive services as well as general preventive health recommendations is available and can be mailed to the  patient at his request.      Marylin Crosby, LPN  07/01/345

## 2018-10-05 NOTE — Patient Instructions (Signed)
Preventive Care 69 Years and Older, Male Preventive care refers to lifestyle choices and visits with your health care provider that can promote health and wellness. This includes:  A yearly physical exam. This is also called an annual well check.  Regular dental and eye exams.  Immunizations.  Screening for certain conditions.  Healthy lifestyle choices, such as diet and exercise. What can I expect for my preventive care visit? Physical exam Your health care provider will check:  Height and weight. These may be used to calculate body mass index (BMI), which is a measurement that tells if you are at a healthy weight.  Heart rate and blood pressure.  Your skin for abnormal spots. Counseling Your health care provider may ask you questions about:  Alcohol, tobacco, and drug use.  Emotional well-being.  Home and relationship well-being.  Sexual activity.  Eating habits.  History of falls.  Memory and ability to understand (cognition).  Work and work Statistician. What immunizations do I need?  Influenza (flu) vaccine  This is recommended every year. Tetanus, diphtheria, and pertussis (Tdap) vaccine  You may need a Td booster every 10 years. Varicella (chickenpox) vaccine  You may need this vaccine if you have not already been vaccinated. Zoster (shingles) vaccine  You may need this after age 69. Pneumococcal conjugate (PCV13) vaccine  One dose is recommended after age 24. Pneumococcal polysaccharide (PPSV23) vaccine  One dose is recommended after age 33. Measles, mumps, and rubella (MMR) vaccine  You may need at least one dose of MMR if you were born in 1957 or later. You may also need a second dose. Meningococcal conjugate (MenACWY) vaccine  You may need this if you have certain conditions. Hepatitis A vaccine  You may need this if you have certain conditions or if you travel or work in places where you may be exposed to hepatitis A. Hepatitis B vaccine   You may need this if you have certain conditions or if you travel or work in places where you may be exposed to hepatitis B. Haemophilus influenzae type b (Hib) vaccine  You may need this if you have certain conditions. You may receive vaccines as individual doses or as more than one vaccine together in one shot (combination vaccines). Talk with your health care provider about the risks and benefits of combination vaccines. What tests do I need? Blood tests  Lipid and cholesterol levels. These may be checked every 5 years, or more frequently depending on your overall health.  Hepatitis C test.  Hepatitis B test. Screening  Lung cancer screening. You may have this screening every year starting at age 69 if you have a 30-pack-year history of smoking and currently smoke or have quit within the past 15 years.  Colorectal cancer screening. All adults should have this screening starting at age 69 and continuing until age 54. Your health care provider may recommend screening at age 69 if you are at increased risk. You will have tests every 1-10 years, depending on your results and the type of screening test.  Prostate cancer screening. Recommendations will vary depending on your family history and other risks.  Diabetes screening. This is done by checking your blood sugar (glucose) after you have not eaten for a while (fasting). You may have this done every 1-3 years.  Abdominal aortic aneurysm (AAA) screening. You may need this if you are a current or former smoker.  Sexually transmitted disease (STD) testing. Follow these instructions at home: Eating and drinking  Eat  a diet that includes fresh fruits and vegetables, whole grains, lean protein, and low-fat dairy products. Limit your intake of foods with high amounts of sugar, saturated fats, and salt.  Take vitamin and mineral supplements as recommended by your health care provider.  Do not drink alcohol if your health care provider  tells you not to drink.  If you drink alcohol: ? Limit how much you have to 0-2 drinks a day. ? Be aware of how much alcohol is in your drink. In the [place] one drink equals one 12 oz bottle of beer (355 mL), one 5 oz glass of wine (148 mL), or one 1 oz glass of hard liquor (44 mL). Lifestyle  Take daily care of your teeth and gums.  Stay active. Exercise for at least 30 minutes on 5 or more days each week.  Do not use any products that contain nicotine or tobacco, such as cigarettes, e-cigarettes, and chewing tobacco. If you need help quitting, ask your health care provider.  If you are sexually active, practice safe sex. Use a condom or other form of protection to prevent STIs (sexually transmitted infections).  Talk with your health care provider about taking a low-dose aspirin or statin. What's next?  Visit your health care provider once a year for a well check visit.  Ask your health care provider how often you should have your eyes and teeth checked.  Stay up to date on all vaccines. This information is not intended to replace advice given to you by your health care provider. Make sure you discuss any questions you have with your health care provider. Document Released: 04/12/2015 Document Revised: 03/10/2018 Document Reviewed: 03/10/2018 Elsevier Patient Education  2020 [employer] Inc[employer]  

## 2018-10-21 ENCOUNTER — Other Ambulatory Visit: Payer: Self-pay | Admitting: Family Medicine

## 2018-10-25 ENCOUNTER — Other Ambulatory Visit: Payer: Self-pay | Admitting: Family Medicine

## 2018-10-25 NOTE — Telephone Encounter (Signed)
Pt aware refill sent to pharmacy yesterday

## 2018-11-04 ENCOUNTER — Other Ambulatory Visit: Payer: Self-pay | Admitting: Family Medicine

## 2018-11-14 ENCOUNTER — Encounter: Payer: Self-pay | Admitting: Gastroenterology

## 2018-12-14 ENCOUNTER — Encounter: Payer: Self-pay | Admitting: Gastroenterology

## 2019-01-18 ENCOUNTER — Other Ambulatory Visit: Payer: Self-pay | Admitting: Family Medicine

## 2019-02-06 ENCOUNTER — Other Ambulatory Visit: Payer: Self-pay | Admitting: Family Medicine

## 2019-02-06 ENCOUNTER — Other Ambulatory Visit: Payer: Medicare PPO

## 2019-02-06 ENCOUNTER — Other Ambulatory Visit: Payer: Self-pay

## 2019-02-06 DIAGNOSIS — E782 Mixed hyperlipidemia: Secondary | ICD-10-CM

## 2019-02-06 DIAGNOSIS — I10 Essential (primary) hypertension: Secondary | ICD-10-CM | POA: Diagnosis not present

## 2019-02-07 ENCOUNTER — Other Ambulatory Visit: Payer: Self-pay

## 2019-02-07 LAB — CMP14+EGFR
ALT: 15 IU/L (ref 0–44)
AST: 18 IU/L (ref 0–40)
Albumin/Globulin Ratio: 1.7 (ref 1.2–2.2)
Albumin: 4.1 g/dL (ref 3.8–4.8)
Alkaline Phosphatase: 85 IU/L (ref 39–117)
BUN/Creatinine Ratio: 13 (ref 10–24)
BUN: 14 mg/dL (ref 8–27)
Bilirubin Total: 0.8 mg/dL (ref 0.0–1.2)
CO2: 22 mmol/L (ref 20–29)
Calcium: 8.8 mg/dL (ref 8.6–10.2)
Chloride: 107 mmol/L — ABNORMAL HIGH (ref 96–106)
Creatinine, Ser: 1.12 mg/dL (ref 0.76–1.27)
GFR calc Af Amer: 77 mL/min/{1.73_m2} (ref >59)
GFR calc non Af Amer: 67 mL/min/{1.73_m2} (ref >59)
Globulin, Total: 2.4 g/dL (ref 1.5–4.5)
Glucose: 86 mg/dL (ref 65–99)
Potassium: 3.6 mmol/L (ref 3.5–5.2)
Sodium: 144 mmol/L (ref 134–144)
Total Protein: 6.5 g/dL (ref 6.0–8.5)

## 2019-02-07 LAB — LIPID PANEL
Chol/HDL Ratio: 2.4 ratio (ref 0.0–5.0)
Cholesterol, Total: 159 mg/dL (ref 100–199)
HDL: 67 mg/dL (ref >39)
LDL Chol Calc (NIH): 78 mg/dL (ref 0–99)
Triglycerides: 72 mg/dL (ref 0–149)
VLDL Cholesterol Cal: 14 mg/dL (ref 5–40)

## 2019-02-07 LAB — CBC WITH DIFFERENTIAL/PLATELET
Basophils Absolute: 0 10*3/uL (ref 0.0–0.2)
Basos: 1 %
EOS (ABSOLUTE): 0.4 10*3/uL (ref 0.0–0.4)
Eos: 8 %
Hematocrit: 43.3 % (ref 37.5–51.0)
Hemoglobin: 14.4 g/dL (ref 13.0–17.7)
Immature Grans (Abs): 0 10*3/uL (ref 0.0–0.1)
Immature Granulocytes: 0 %
Lymphocytes Absolute: 1.7 10*3/uL (ref 0.7–3.1)
Lymphs: 39 %
MCH: 29.4 pg (ref 26.6–33.0)
MCHC: 33.3 g/dL (ref 31.5–35.7)
MCV: 89 fL (ref 79–97)
Monocytes Absolute: 0.4 10*3/uL (ref 0.1–0.9)
Monocytes: 10 %
Neutrophils Absolute: 1.8 10*3/uL (ref 1.4–7.0)
Neutrophils: 42 %
Platelets: 162 10*3/uL (ref 150–450)
RBC: 4.89 x10E6/uL (ref 4.14–5.80)
RDW: 13.9 % (ref 11.6–15.4)
WBC: 4.3 10*3/uL (ref 3.4–10.8)

## 2019-02-08 ENCOUNTER — Ambulatory Visit (INDEPENDENT_AMBULATORY_CARE_PROVIDER_SITE_OTHER): Payer: Medicare PPO | Admitting: Family Medicine

## 2019-02-08 ENCOUNTER — Encounter: Payer: Self-pay | Admitting: Family Medicine

## 2019-02-08 VITALS — BP 133/77 | HR 65 | Temp 98.2°F | Ht 71.0 in | Wt 190.2 lb

## 2019-02-08 DIAGNOSIS — E782 Mixed hyperlipidemia: Secondary | ICD-10-CM | POA: Diagnosis not present

## 2019-02-08 DIAGNOSIS — Z23 Encounter for immunization: Secondary | ICD-10-CM | POA: Diagnosis not present

## 2019-02-08 DIAGNOSIS — Z1159 Encounter for screening for other viral diseases: Secondary | ICD-10-CM | POA: Diagnosis not present

## 2019-02-08 DIAGNOSIS — Z0001 Encounter for general adult medical examination with abnormal findings: Secondary | ICD-10-CM

## 2019-02-08 DIAGNOSIS — Z0000 Encounter for general adult medical examination without abnormal findings: Secondary | ICD-10-CM

## 2019-02-08 DIAGNOSIS — I10 Essential (primary) hypertension: Secondary | ICD-10-CM | POA: Diagnosis not present

## 2019-02-08 MED ORDER — SIMVASTATIN 20 MG PO TABS: ORAL_TABLET | ORAL | 1 refills | Status: DC

## 2019-02-08 MED ORDER — LISINOPRIL 20 MG PO TABS: ORAL_TABLET | ORAL | 1 refills | Status: DC

## 2019-02-08 MED ORDER — AMLODIPINE BESYLATE 10 MG PO TABS: 10.0000 mg | ORAL_TABLET | Freq: Every day | ORAL | 1 refills | Status: DC

## 2019-02-08 NOTE — Progress Notes (Signed)
Subjective:  Patient ID: Jeffrey Downs, male    DOB: November 22, 1950  Age: 68 y.o. MRN: [id number]  CC: Annual Exam   HPI Jayston Folgar Cuoco presents for Complete Physical.  Depression screen [facility] 2/9 02/08/2019 10/05/2018 12/30/2017  Decreased Interest 0 0 0  Down, Depressed, Hopeless 0 0 -  PHQ - 2 Score 0 0 0    History Kycen has a past medical history of Adenomatous colon polyp, Hyperlipidemia, Hypertension, Kidney stones, and Vitamin D deficiency.   He has a past surgical history that includes Tonsillectomy and Colonoscopy w/ polypectomy.   His family history includes Cancer in his father; Hyperlipidemia in his sister; Hypertension in his mother and sister; Thyroid disease in his mother.He reports that he has never smoked. He has never used smokeless tobacco. He reports that he does not drink alcohol or use drugs.    ROS Review of Systems  Constitutional: Negative for activity change, fatigue and unexpected weight change.  HENT: Negative for congestion, ear pain, hearing loss, postnasal drip and trouble swallowing.   Eyes: Negative for pain and visual disturbance.  Respiratory: Negative for cough, chest tightness and shortness of breath.   Cardiovascular: Negative for chest pain, palpitations and leg swelling.  Gastrointestinal: Negative for abdominal distention, abdominal pain, blood in stool, constipation, diarrhea, nausea and vomiting.  Endocrine: Negative for cold intolerance, heat intolerance and polydipsia.  Genitourinary: Negative for difficulty urinating, dysuria, flank pain, frequency and urgency.  Musculoskeletal: Negative for arthralgias and joint swelling.  Skin: Negative for color change, rash and wound.  Neurological: Negative for dizziness, syncope, speech difficulty, weakness, light-headedness, numbness and headaches.  Hematological: Does not bruise/bleed easily.  Psychiatric/Behavioral: Negative for confusion, decreased concentration, dysphoric mood and sleep  disturbance. The patient is not nervous/anxious.     Objective:  BP 133/77   Pulse 65   Temp 98.2 F (36.8 C) (Temporal)   Ht 5\' 11"  (1.803 m)   Wt 190 lb 3.2 oz (86.3 kg)   SpO2 97%   BMI 26.53 kg/m   BP Readings from Last 3 Encounters:  02/08/19 133/77  12/30/17 129/78  06/29/17 129/66    Wt Readings from Last 3 Encounters:  02/08/19 190 lb 3.2 oz (86.3 kg)  12/30/17 193 lb (87.5 kg)  06/29/17 195 lb 2 oz (88.5 kg)     Physical Exam Constitutional:      Appearance: He is well-developed.  HENT:     Head: Normocephalic and atraumatic.  Eyes:     Pupils: Pupils are equal, round, and reactive to light.  Neck:     Musculoskeletal: Normal range of motion.     Thyroid: No thyromegaly.     Trachea: No tracheal deviation.  Cardiovascular:     Rate and Rhythm: Normal rate and regular rhythm.     Heart sounds: Normal heart sounds. No murmur. No friction rub. No gallop.   Pulmonary:     Breath sounds: Normal breath sounds. No wheezing or rales.  Abdominal:     General: Bowel sounds are normal. There is no distension.     Palpations: Abdomen is soft. There is no mass.     Tenderness: There is no abdominal tenderness.     Hernia: There is no hernia in the left inguinal area.  Genitourinary:    Penis: Normal.      Scrotum/Testes: Normal.  Musculoskeletal: Normal range of motion.  Lymphadenopathy:     Cervical: No cervical adenopathy.  Skin:    General: Skin is warm and  dry.  Neurological:     Mental Status: He is alert and oriented to person, place, and time.       Assessment & Plan:   Dawuan was seen today for annual exam.  Diagnoses and all orders for this visit:  Well adult exam  Need for immunization against influenza -     Flu Vaccine QUAD High Dose(Fluad)  Essential hypertension  Mixed hyperlipidemia  Need for hepatitis C screening test -     Hepatitis C antibody  Other orders -     amLODipine (NORVASC) 10 MG tablet; Take 1 tablet (10 mg  total) by mouth daily. -     lisinopril (ZESTRIL) 20 MG tablet; TAKE 1 TABLET EVERY DAY (NEED MD APPOINTMENT) -     simvastatin (ZOCOR) 20 MG tablet; TAKE 1/2 TABLET EVERY DAY AT 6 PM       I have changed Keaun D. Barrell's amLODipine. I am also having him maintain his aspirin, Fish Oil, OVER THE COUNTER MEDICATION, Cholecalciferol, lisinopril, and simvastatin.  Allergies as of 02/08/2019   No Known Allergies     Medication List       Accurate as of February 08, 2019  7:43 PM. If you have any questions, ask your nurse or doctor.        amLODipine 10 MG tablet Commonly known as: NORVASC Take 1 tablet (10 mg total) by mouth daily.   aspirin 81 MG chewable tablet Chew 81 mg by mouth daily.   Cholecalciferol 50 MCG (2000 UT) Caps Commonly known as: D3 Super Strength TAKE 2 CAPSULES  (4000  UNITS) EVERY DAY   Fish Oil 1000 MG Caps Take 1,000 mg by mouth every other day.   lisinopril 20 MG tablet Commonly known as: ZESTRIL TAKE 1 TABLET EVERY DAY (NEED MD APPOINTMENT)   OVER THE COUNTER MEDICATION Centrum Silver 50+ Mens   simvastatin 20 MG tablet Commonly known as: ZOCOR TAKE 1/2 TABLET EVERY DAY AT 6 PM        Follow-up: Return in about 6 months (around 08/08/2019).  Claretta Fraise, M.D.

## 2019-02-09 LAB — PSA, TOTAL AND FREE
PSA, Free Pct: 36.9 %
PSA, Free: 0.48 ng/mL
Prostate Specific Ag, Serum: 1.3 ng/mL (ref 0.0–4.0)

## 2019-02-09 LAB — HEPATITIS C ANTIBODY: Hep C Virus Ab: <0.1 s/co ratio (ref 0.0–0.9)

## 2019-02-09 NOTE — Progress Notes (Signed)
Hello Jeffrey Downs,  Your lab result is normal and/or stable.Some minor variations that are not significant are commonly marked abnormal, but do not represent any medical problem for you.  Best regards, Chibuike Fleek, M.D.

## 2019-02-28 ENCOUNTER — Encounter: Payer: Self-pay | Admitting: Gastroenterology

## 2019-03-22 ENCOUNTER — Other Ambulatory Visit: Payer: Self-pay

## 2019-03-22 ENCOUNTER — Ambulatory Visit (AMBULATORY_SURGERY_CENTER): Payer: Medicare PPO | Admitting: *Deleted

## 2019-03-22 ENCOUNTER — Encounter: Payer: Self-pay | Admitting: Gastroenterology

## 2019-03-22 VITALS — Temp 97.3°F | Ht 71.0 in | Wt 192.6 lb

## 2019-03-22 DIAGNOSIS — Z86010 Personal history of colonic polyps: Secondary | ICD-10-CM

## 2019-03-22 DIAGNOSIS — Z1159 Encounter for screening for other viral diseases: Secondary | ICD-10-CM

## 2019-03-22 MED ORDER — SUPREP BOWEL PREP KIT 17.5-3.13-1.6 GM/177ML PO SOLN: 1.0000 | Freq: Once | ORAL | 0 refills | Status: AC

## 2019-03-22 NOTE — Progress Notes (Signed)
No egg or soy allergy known to patient  No issues with past sedation with any surgeries  or procedures, no intubation problems  No diet pills per patient No home 02 use per patient  No blood thinners per patient  Pt denies issues with constipation  No A fib or A flutter  EMMI video sent to pt's e mail  Pt asked me to send Suprep to [facility] and if he has not received it in 1 week he will let us know so we can send it to [facility]   Due to the COVID-19 pandemic we are asking patients to follow these guidelines. Please only bring one care partner. Please be aware that your care partner may wait in the car in the parking lot or if they feel like they will be too hot to wait in the car, they may wait in the lobby on the 4th floor. All care partners are required to wear a mask the entire time (we do not have any that we can provide them), they need to practice social distancing, and we will do a Covid check for all patient's and care partners when you arrive. Also we will check their temperature and your temperature. If the care partner waits in their car they need to stay in the parking lot the entire time and we will call them on their cell phone when the patient is ready for discharge so they can bring the car to the front of the building. Also all patient's will need to wear a mask into building.

## 2019-03-28 ENCOUNTER — Telehealth: Payer: Self-pay | Admitting: Family Medicine

## 2019-03-28 DIAGNOSIS — Z1211 Encounter for screening for malignant neoplasm of colon: Secondary | ICD-10-CM

## 2019-03-28 NOTE — Telephone Encounter (Signed)
REFERRAL REQUEST Telephone Note  What type of referral do you need? Gastro for Colonoscopy   Have you been seen at our office for this problem? Patient was seen back in 01/2019 for a CPE. (Advise that they will likely need an appointment with their PCP before a referral can be done)  Is there a particular doctor or location that you prefer? [facility] - Dr. Fuller Plan - patient has appt - just needs Ref for insurance purposes.  Patient notified that referrals can take up to a week or longer to process. If they haven't heard anything within a week they should call back and speak with the referral department.

## 2019-04-03 ENCOUNTER — Other Ambulatory Visit (HOSPITAL_COMMUNITY)
Admission: RE | Admit: 2019-04-03 | Discharge: 2019-04-03 | Disposition: A | Discharge status: Home / Self Care | Payer: Medicare PPO | Source: Ambulatory Visit | Attending: Gastroenterology | Admitting: Gastroenterology

## 2019-04-03 ENCOUNTER — Other Ambulatory Visit: Payer: Self-pay

## 2019-04-03 DIAGNOSIS — Z01812 Encounter for preprocedural laboratory examination: Secondary | ICD-10-CM | POA: Insufficient documentation

## 2019-04-03 DIAGNOSIS — Z20822 Contact with and (suspected) exposure to covid-19: Secondary | ICD-10-CM | POA: Diagnosis not present

## 2019-04-04 LAB — SARS CORONAVIRUS 2 (TAT 6-24 HRS): SARS Coronavirus 2: NEGATIVE

## 2019-04-05 ENCOUNTER — Encounter: Payer: Self-pay | Admitting: Gastroenterology

## 2019-04-05 ENCOUNTER — Ambulatory Visit (AMBULATORY_SURGERY_CENTER): Payer: Medicare PPO | Admitting: Gastroenterology

## 2019-04-05 ENCOUNTER — Other Ambulatory Visit: Payer: Self-pay

## 2019-04-05 VITALS — BP 116/68 | HR 62 | Temp 98.2°F | Resp 18 | Ht 71.0 in | Wt 192.6 lb

## 2019-04-05 DIAGNOSIS — Z1211 Encounter for screening for malignant neoplasm of colon: Secondary | ICD-10-CM | POA: Diagnosis not present

## 2019-04-05 DIAGNOSIS — D120 Benign neoplasm of cecum: Secondary | ICD-10-CM | POA: Diagnosis not present

## 2019-04-05 DIAGNOSIS — Z86010 Personal history of colonic polyps: Secondary | ICD-10-CM

## 2019-04-05 DIAGNOSIS — D122 Benign neoplasm of ascending colon: Secondary | ICD-10-CM | POA: Diagnosis not present

## 2019-04-05 DIAGNOSIS — D123 Benign neoplasm of transverse colon: Secondary | ICD-10-CM

## 2019-04-05 MED ORDER — SODIUM CHLORIDE 0.9 % IV SOLN: 500.0000 mL | Freq: Once | INTRAVENOUS | Status: DC

## 2019-04-05 NOTE — Progress Notes (Signed)
To PACU, VSS. Report to [email or web address] 

## 2019-04-05 NOTE — Op Note (Signed)
[name]: Jeffrey Downs Procedure Date: 04/05/2019 9:54 AM MRN: [id number] Endoscopist: Ladene Artist , MD Age: 69 Referring MD:  Date of Birth: 1950/07/19 Gender: Male Account #: [id number] Procedure:                Colonoscopy Indications:              Surveillance: Personal history of adenomatous                            polyps on last colonoscopy 5 years ago Medicines:                Monitored Anesthesia Care Procedure:                Pre-Anesthesia Assessment:                           - Prior to the procedure, a History and Physical                            was performed, and patient medications and                            allergies were reviewed. The patient's tolerance of                            previous anesthesia was also reviewed. The risks                            and benefits of the procedure and the sedation                            options and risks were discussed with the patient.                            All questions were answered, and informed consent                            was obtained. Prior Anticoagulants: The patient has                            taken no previous anticoagulant or antiplatelet                            agents. ASA Grade Assessment: II - A patient with                            mild systemic disease. After reviewing the risks                            and benefits, the patient was deemed in                            satisfactory condition to undergo the procedure.  After obtaining informed consent, the colonoscope                            was passed under direct vision. Throughout the                            procedure, the patient's blood pressure, pulse, and                            oxygen saturations were monitored continuously. The                            Colonoscope was introduced through the anus and                            advanced to the the cecum,  identified by                            appendiceal orifice and ileocecal valve. The                            ileocecal valve, appendiceal orifice, and rectum                            were photographed. The quality of the bowel                            preparation was good. The colonoscopy was performed                            without difficulty. The patient tolerated the                            procedure well. Scope In: 10:00:36 AM Scope Out: 10:18:41 AM Scope Withdrawal Time: 0 hours 16 minutes 28 seconds  Total Procedure Duration: 0 hours 18 minutes 5 seconds  Findings:                 The perianal and digital rectal examinations were                            normal.                           Six sessile polyps were found in the transverse                            colon (2), ascending colon (3) and cecum (1). The                            polyps were 6 to 8 mm in size. These polyps were                            removed with a cold snare. Resection and retrieval  were complete.                           External hemorrhoids were found during                            retroflexion. The hemorrhoids were medium-sized.                           The exam was otherwise without abnormality on                            direct and retroflexion views. Complications:            No immediate complications. Estimated blood loss:                            None. Estimated Blood Loss:     Estimated blood loss: none. Impression:               - Six 6 to 8 mm polyps in the transverse colon, in                            the ascending colon and in the cecum, removed with                            a cold snare. Resected and retrieved.                           - External hemorrhoids.                           - The examination was otherwise normal on direct                            and retroflexion views. Recommendation:           - Repeat colonoscopy,  likely in 3 years, after                            studies are complete for surveillance based on                            pathology results.                           - Patient has a contact number available for                            emergencies. The signs and symptoms of potential                            delayed complications were discussed with the                            patient. Return to normal activities tomorrow.  Written discharge instructions were provided to the                            patient.                           - Resume previous diet.                           - Continue present medications.                           - Await pathology results. Ladene Artist, MD 04/05/2019 10:22:43 AM This report has been signed electronically.

## 2019-04-05 NOTE — Progress Notes (Signed)
Called to room to assist during endoscopic procedure.  Patient ID and intended procedure confirmed with present staff. Received instructions for my participation in the procedure from the performing physician.  

## 2019-04-05 NOTE — Progress Notes (Signed)
Pt's states no medical or surgical changes since previsit or office visit.  Temp JB Vitals KA

## 2019-04-05 NOTE — Patient Instructions (Signed)
Impression/Recommendations:  Polyp handout given to patient. Hemorrhoid handout given to patient.  Repeat colonoscopy likely in 3 years.  Resume previous diet. Continue present medications. Await pathology results.  YOU HAD AN ENDOSCOPIC PROCEDURE TODAY AT [facility]:   Refer to the procedure report that was given to you for any specific questions about what was found during the examination.  If the procedure report does not answer your questions, please call your gastroenterologist to clarify.  If you requested that your care partner not be given the details of your procedure findings, then the procedure report has been included in a sealed envelope for you to review at your convenience later.  YOU SHOULD EXPECT: Some feelings of bloating in the abdomen. Passage of more gas than usual.  Walking can help get rid of the air that was put into your GI tract during the procedure and reduce the bloating. If you had a lower endoscopy (such as a colonoscopy or flexible sigmoidoscopy) you may notice spotting of blood in your stool or on the toilet paper. If you underwent a bowel prep for your procedure, you may not have a normal bowel movement for a few days.  Please Note:  You might notice some irritation and congestion in your nose or some drainage.  This is from the oxygen used during your procedure.  There is no need for concern and it should clear up in a day or so.  SYMPTOMS TO REPORT IMMEDIATELY:   Following lower endoscopy (colonoscopy or flexible sigmoidoscopy):  Excessive amounts of blood in the stool  Significant tenderness or worsening of abdominal pains  Swelling of the abdomen that is new, acute  Fever of 100F or higher For urgent or emergent issues, a gastroenterologist can be reached at any hour by calling [phone].   DIET:  We do recommend a small meal at first, but then you may proceed to your regular diet.  Drink plenty of fluids but you should avoid  alcoholic beverages for 24 hours.  ACTIVITY:  You should plan to take it easy for the rest of today and you should NOT DRIVE or use heavy machinery until tomorrow (because of the sedation medicines used during the test).    FOLLOW UP: Our staff will call the number listed on your records 48-72 hours following your procedure to check on you and address any questions or concerns that you may have regarding the information given to you following your procedure. If we do not reach you, we will leave a message.  We will attempt to reach you two times.  During this call, we will ask if you have developed any symptoms of COVID 19. If you develop any symptoms (ie: fever, flu-like symptoms, shortness of breath, cough etc.) before then, please call [phone].  If you test positive for Covid 19 in the 2 weeks post procedure, please call and report this information to [place].    If any biopsies were taken you will be contacted by phone or by letter within the next 1-3 weeks.  Please call us at [phone] if you have not heard about the biopsies in 3 weeks.    SIGNATURES/CONFIDENTIALITY: You and/or your care partner have signed paperwork which will be entered into your electronic medical record.  These signatures attest to the fact that that the information above on your After Visit Summary has been reviewed and is understood.  Full responsibility of the confidentiality of this discharge information lies with you and/or your  care-partner.

## 2019-04-07 ENCOUNTER — Telehealth: Payer: Self-pay | Admitting: *Deleted

## 2019-04-07 NOTE — Telephone Encounter (Signed)
  Follow up Call-  Call back number 04/05/2019  Post procedure Call Back phone  # [phone]  Permission to leave phone message Yes  Some recent data might be hidden     Patient questions:  Do you have a fever, pain , or abdominal swelling? No. Pain Score  0 *  Have you tolerated food without any problems? Yes.    Have you been able to return to your normal activities? Yes.    Do you have any questions about your discharge instructions: Diet   No. Medications  No. Follow up visit  No.  Do you have questions or concerns about your Care? No.  Actions: * If pain score is 4 or above: No action needed, pain <4.  1. Have you developed a fever since your procedure? no  2.   Have you had an respiratory symptoms (SOB or cough) since your procedure? no  3.   Have you tested positive for COVID 19 since your procedure no  4.   Have you had any family members/close contacts diagnosed with the COVID 19 since your procedure?  no   If yes to any of these questions please route to Joylene John, RN and Alphonsa Gin, Therapist, sports.

## 2019-04-10 ENCOUNTER — Encounter: Payer: Self-pay | Admitting: Gastroenterology

## 2019-08-08 ENCOUNTER — Encounter: Payer: Self-pay | Admitting: Family Medicine

## 2019-08-08 ENCOUNTER — Ambulatory Visit (INDEPENDENT_AMBULATORY_CARE_PROVIDER_SITE_OTHER): Payer: Medicare Other | Admitting: Family Medicine

## 2019-08-08 ENCOUNTER — Other Ambulatory Visit: Payer: Self-pay

## 2019-08-08 VITALS — BP 131/77 | HR 62 | Temp 97.8°F | Resp 20 | Ht 71.0 in | Wt 193.0 lb

## 2019-08-08 DIAGNOSIS — E559 Vitamin D deficiency, unspecified: Secondary | ICD-10-CM | POA: Diagnosis not present

## 2019-08-08 DIAGNOSIS — E782 Mixed hyperlipidemia: Secondary | ICD-10-CM | POA: Diagnosis not present

## 2019-08-08 DIAGNOSIS — I10 Essential (primary) hypertension: Secondary | ICD-10-CM

## 2019-08-08 MED ORDER — IRON 325 (65 FE) MG PO TABS: 325.0000 mg | ORAL_TABLET | ORAL | 1 refills | Status: DC

## 2019-08-08 MED ORDER — LISINOPRIL 20 MG PO TABS: ORAL_TABLET | ORAL | 1 refills | Status: DC

## 2019-08-08 MED ORDER — SIMVASTATIN 20 MG PO TABS: ORAL_TABLET | ORAL | 1 refills | Status: DC

## 2019-08-08 MED ORDER — AMLODIPINE BESYLATE 10 MG PO TABS: 10.0000 mg | ORAL_TABLET | Freq: Every day | ORAL | 1 refills | Status: DC

## 2019-08-08 NOTE — Progress Notes (Signed)
Subjective:  Patient ID: Jeffrey Downs, male    DOB: October 29, 1950  Age: 69 y.o. MRN: [id number]  CC: Medical Management of Chronic Issues   HPI Jeffrey Downs presents for  follow-up of hypertension. Patient has no history of headache chest pain or shortness of breath or recent cough. Patient also denies symptoms of TIA such as focal numbness or weakness. Patient denies side effects from medication. States taking it regularly.   in for follow-up of elevated cholesterol. Doing well without complaints on current medication. Denies side effects of statin including myalgia and arthralgia and nausea. Currently no chest pain, shortness of breath or other cardiovascular related symptoms noted.    History Jeffrey Downs has a past medical history of Adenomatous colon polyp, Anemia, Hyperlipidemia, Hypertension, Kidney stones, and Vitamin D deficiency.   He has a past surgical history that includes Tonsillectomy; Colonoscopy w/ polypectomy; Colonoscopy; Polypectomy; and birth mark removed as infant.   His family history includes Cancer in his father and maternal uncle; Hyperlipidemia in his sister; Hypertension in his mother and sister; Lung cancer in his father; Thyroid disease in his mother.He reports that he has never smoked. He has never used smokeless tobacco. He reports that he does not drink alcohol or use drugs.  Current Outpatient Medications on File Prior to Visit  Medication Sig Dispense Refill  . aspirin 81 MG chewable tablet Chew 81 mg by mouth daily.    . Cholecalciferol (D3 SUPER STRENGTH) 2000 units CAPS TAKE 2 CAPSULES  (4000  UNITS) EVERY DAY 180 capsule 1  . Omega-3 Fatty Acids (FISH OIL) 1000 MG CAPS Take 1,000 mg by mouth every other day.    Marland Kitchen OVER THE COUNTER MEDICATION Centrum Silver 50+ Mens     No current facility-administered medications on file prior to visit.    ROS Review of Systems  Constitutional: Negative.   HENT: Negative.   Eyes: Negative for visual disturbance.    Respiratory: Negative for cough and shortness of breath.   Cardiovascular: Negative for chest pain and leg swelling.  Gastrointestinal: Negative for abdominal pain, diarrhea, nausea and vomiting.  Genitourinary: Negative for difficulty urinating.  Musculoskeletal: Negative for arthralgias and myalgias.  Skin: Negative for rash.  Neurological: Negative for headaches.  Psychiatric/Behavioral: Negative for sleep disturbance.    Objective:  BP 131/77   Pulse 62   Temp 97.8 F (36.6 C) (Temporal)   Resp 20   Ht 5' 11"  (1.803 m)   Wt 193 lb (87.5 kg)   SpO2 97%   BMI 26.92 kg/m   BP Readings from Last 3 Encounters:  08/08/19 131/77  04/05/19 116/68  02/08/19 133/77    Wt Readings from Last 3 Encounters:  08/08/19 193 lb (87.5 kg)  04/05/19 192 lb 9.6 oz (87.4 kg)  03/22/19 192 lb 9.6 oz (87.4 kg)     Physical Exam Vitals reviewed.  Constitutional:      Appearance: He is well-developed.  HENT:     Head: Normocephalic and atraumatic.     Right Ear: Tympanic membrane, ear canal and external ear normal.     Left Ear: Tympanic membrane, ear canal and external ear normal.     Mouth/Throat:     Pharynx: No oropharyngeal exudate or posterior oropharyngeal erythema.  Eyes:     Pupils: Pupils are equal, round, and reactive to light.  Cardiovascular:     Rate and Rhythm: Normal rate and regular rhythm.     Heart sounds: No murmur.  Pulmonary:  Effort: No respiratory distress.     Breath sounds: Normal breath sounds.  Abdominal:     General: Abdomen is flat.     Palpations: Abdomen is soft.     Tenderness: There is no abdominal tenderness.  Musculoskeletal:     Cervical back: Normal range of motion and neck supple.  Skin:    General: Skin is warm.  Neurological:     Mental Status: He is alert and oriented to person, place, and time.       Assessment & Plan:   Jeffrey Downs was seen today for medical management of chronic issues.  Diagnoses and all orders for this  visit:  Essential hypertension -     CBC with Differential/Platelet -     CMP14+EGFR -     amLODipine (NORVASC) 10 MG tablet; Take 1 tablet (10 mg total) by mouth daily. -     lisinopril (ZESTRIL) 20 MG tablet; TAKE 1 TABLET EVERY DAY (NEED MD APPOINTMENT)  Mixed hyperlipidemia -     CBC with Differential/Platelet -     CMP14+EGFR -     Lipid panel -     simvastatin (ZOCOR) 20 MG tablet; TAKE 1/2 TABLET EVERY DAY AT 6 PM  Vitamin D deficiency -     CBC with Differential/Platelet -     CMP14+EGFR -     VITAMIN D 25 Hydroxy (Vit-D Deficiency, Fractures)  Other orders -     Ferrous Sulfate (IRON) 325 (65 Fe) MG TABS; Take 1 tablet (325 mg total) by mouth once a week.   Allergies as of 08/08/2019   No Known Allergies     Medication List       Accurate as of Aug 08, 2019  9:17 AM. If you have any questions, ask your nurse or doctor.        amLODipine 10 MG tablet Commonly known as: NORVASC Take 1 tablet (10 mg total) by mouth daily.   aspirin 81 MG chewable tablet Chew 81 mg by mouth daily.   Cholecalciferol 50 MCG (2000 UT) Caps Commonly known as: D3 Super Strength TAKE 2 CAPSULES  (4000  UNITS) EVERY DAY   Fish Oil 1000 MG Caps Take 1,000 mg by mouth every other day.   Iron 325 (65 Fe) MG Tabs Take 1 tablet (325 mg total) by mouth once a week. What changed: how much to take Changed by: Claretta Fraise, MD   lisinopril 20 MG tablet Commonly known as: ZESTRIL TAKE 1 TABLET EVERY DAY (NEED MD APPOINTMENT)   OVER THE COUNTER MEDICATION Centrum Silver 50+ Mens   simvastatin 20 MG tablet Commonly known as: ZOCOR TAKE 1/2 TABLET EVERY DAY AT 6 PM       Meds ordered this encounter  Medications  . amLODipine (NORVASC) 10 MG tablet    Sig: Take 1 tablet (10 mg total) by mouth daily.    Dispense:  90 tablet    Refill:  1  . Ferrous Sulfate (IRON) 325 (65 Fe) MG TABS    Sig: Take 1 tablet (325 mg total) by mouth once a week.    Dispense:  30 tablet    Refill:   1  . lisinopril (ZESTRIL) 20 MG tablet    Sig: TAKE 1 TABLET EVERY DAY (NEED MD APPOINTMENT)    Dispense:  90 tablet    Refill:  1  . simvastatin (ZOCOR) 20 MG tablet    Sig: TAKE 1/2 TABLET EVERY DAY AT 6 PM    Dispense:  57  tablet    Refill:  1      Follow-up: Return in about 6 months (around 02/08/2020).  Claretta Fraise, M.D.

## 2019-08-09 LAB — CBC WITH DIFFERENTIAL/PLATELET
Basophils Absolute: 0.1 10*3/uL (ref 0.0–0.2)
Basos: 1 %
EOS (ABSOLUTE): 0.3 10*3/uL (ref 0.0–0.4)
Eos: 6 %
Hematocrit: 44.5 % (ref 37.5–51.0)
Hemoglobin: 14.9 g/dL (ref 13.0–17.7)
Immature Grans (Abs): 0 10*3/uL (ref 0.0–0.1)
Immature Granulocytes: 0 %
Lymphocytes Absolute: 1.5 10*3/uL (ref 0.7–3.1)
Lymphs: 33 %
MCH: 29.9 pg (ref 26.6–33.0)
MCHC: 33.5 g/dL (ref 31.5–35.7)
MCV: 89 fL (ref 79–97)
Monocytes Absolute: 0.5 10*3/uL (ref 0.1–0.9)
Monocytes: 12 %
Neutrophils Absolute: 2.2 10*3/uL (ref 1.4–7.0)
Neutrophils: 48 %
Platelets: 202 10*3/uL (ref 150–450)
RBC: 4.99 x10E6/uL (ref 4.14–5.80)
RDW: 13.5 % (ref 11.6–15.4)
WBC: 4.5 10*3/uL (ref 3.4–10.8)

## 2019-08-09 LAB — CMP14+EGFR
ALT: 15 IU/L (ref 0–44)
AST: 21 IU/L (ref 0–40)
Albumin/Globulin Ratio: 1.9 (ref 1.2–2.2)
Albumin: 4.2 g/dL (ref 3.8–4.8)
Alkaline Phosphatase: 95 IU/L (ref 39–117)
BUN/Creatinine Ratio: 13 (ref 10–24)
BUN: 17 mg/dL (ref 8–27)
Bilirubin Total: 1.2 mg/dL (ref 0.0–1.2)
CO2: 21 mmol/L (ref 20–29)
Calcium: 8.8 mg/dL (ref 8.6–10.2)
Chloride: 107 mmol/L — ABNORMAL HIGH (ref 96–106)
Creatinine, Ser: 1.26 mg/dL (ref 0.76–1.27)
GFR calc Af Amer: 67 mL/min/{1.73_m2} (ref >59)
GFR calc non Af Amer: 58 mL/min/{1.73_m2} — ABNORMAL LOW (ref >59)
Globulin, Total: 2.2 g/dL (ref 1.5–4.5)
Glucose: 86 mg/dL (ref 65–99)
Potassium: 4.1 mmol/L (ref 3.5–5.2)
Sodium: 143 mmol/L (ref 134–144)
Total Protein: 6.4 g/dL (ref 6.0–8.5)

## 2019-08-09 LAB — LIPID PANEL
Chol/HDL Ratio: 2.4 ratio (ref 0.0–5.0)
Cholesterol, Total: 174 mg/dL (ref 100–199)
HDL: 72 mg/dL (ref >39)
LDL Chol Calc (NIH): 89 mg/dL (ref 0–99)
Triglycerides: 69 mg/dL (ref 0–149)
VLDL Cholesterol Cal: 13 mg/dL (ref 5–40)

## 2019-08-09 LAB — VITAMIN D 25 HYDROXY (VIT D DEFICIENCY, FRACTURES): Vit D, 25-Hydroxy: 45 ng/mL (ref 30.0–100.0)

## 2019-08-09 NOTE — Progress Notes (Signed)
Hello Altin,  Your lab result is normal and/or stable.Some minor variations that are not significant are commonly marked abnormal, but do not represent any medical problem for you.  Best regards, Mikaiya Tramble, M.D.

## 2020-02-03 ENCOUNTER — Other Ambulatory Visit: Payer: Self-pay | Admitting: Family Medicine

## 2020-02-03 DIAGNOSIS — E782 Mixed hyperlipidemia: Secondary | ICD-10-CM

## 2020-02-03 DIAGNOSIS — I10 Essential (primary) hypertension: Secondary | ICD-10-CM

## 2020-02-08 ENCOUNTER — Other Ambulatory Visit: Payer: Self-pay

## 2020-02-08 ENCOUNTER — Encounter: Payer: Self-pay | Admitting: Family Medicine

## 2020-02-08 ENCOUNTER — Ambulatory Visit (INDEPENDENT_AMBULATORY_CARE_PROVIDER_SITE_OTHER): Payer: Medicare Other | Admitting: Family Medicine

## 2020-02-08 VITALS — BP 136/78 | HR 66 | Temp 97.6°F | Resp 20 | Ht 71.0 in | Wt 190.0 lb

## 2020-02-08 DIAGNOSIS — Z23 Encounter for immunization: Secondary | ICD-10-CM | POA: Diagnosis not present

## 2020-02-08 DIAGNOSIS — E782 Mixed hyperlipidemia: Secondary | ICD-10-CM | POA: Diagnosis not present

## 2020-02-08 DIAGNOSIS — D509 Iron deficiency anemia, unspecified: Secondary | ICD-10-CM | POA: Diagnosis not present

## 2020-02-08 DIAGNOSIS — I10 Essential (primary) hypertension: Secondary | ICD-10-CM

## 2020-02-08 LAB — CBC WITH DIFFERENTIAL/PLATELET
Basophils Absolute: 0.1 10*3/uL (ref 0.0–0.2)
Basos: 1 %
EOS (ABSOLUTE): 0.3 10*3/uL (ref 0.0–0.4)
Eos: 7 %
Hematocrit: 44.2 % (ref 37.5–51.0)
Hemoglobin: 14.4 g/dL (ref 13.0–17.7)
Immature Grans (Abs): 0 10*3/uL (ref 0.0–0.1)
Immature Granulocytes: 0 %
Lymphocytes Absolute: 1.2 10*3/uL (ref 0.7–3.1)
Lymphs: 31 %
MCH: 29.6 pg (ref 26.6–33.0)
MCHC: 32.6 g/dL (ref 31.5–35.7)
MCV: 91 fL (ref 79–97)
Monocytes Absolute: 0.5 10*3/uL (ref 0.1–0.9)
Monocytes: 13 %
Neutrophils Absolute: 1.9 10*3/uL (ref 1.4–7.0)
Neutrophils: 48 %
Platelets: 162 10*3/uL (ref 150–450)
RBC: 4.87 x10E6/uL (ref 4.14–5.80)
RDW: 13.6 % (ref 11.6–15.4)
WBC: 3.9 10*3/uL (ref 3.4–10.8)

## 2020-02-08 LAB — LIPID PANEL
Chol/HDL Ratio: 2.3 ratio (ref 0.0–5.0)
Cholesterol, Total: 161 mg/dL (ref 100–199)
HDL: 71 mg/dL (ref >39)
LDL Chol Calc (NIH): 79 mg/dL (ref 0–99)
Triglycerides: 55 mg/dL (ref 0–149)
VLDL Cholesterol Cal: 11 mg/dL (ref 5–40)

## 2020-02-08 LAB — CMP14+EGFR
ALT: 15 IU/L (ref 0–44)
AST: 19 IU/L (ref 0–40)
Albumin/Globulin Ratio: 1.8 (ref 1.2–2.2)
Albumin: 4.1 g/dL (ref 3.8–4.8)
Alkaline Phosphatase: 89 IU/L (ref 44–121)
BUN/Creatinine Ratio: 15 (ref 10–24)
BUN: 18 mg/dL (ref 8–27)
Bilirubin Total: 1.1 mg/dL (ref 0.0–1.2)
CO2: 22 mmol/L (ref 20–29)
Calcium: 9 mg/dL (ref 8.6–10.2)
Chloride: 113 mmol/L — ABNORMAL HIGH (ref 96–106)
Creatinine, Ser: 1.22 mg/dL (ref 0.76–1.27)
GFR calc Af Amer: 69 mL/min/{1.73_m2} (ref >59)
GFR calc non Af Amer: 60 mL/min/{1.73_m2} (ref >59)
Globulin, Total: 2.3 g/dL (ref 1.5–4.5)
Glucose: 85 mg/dL (ref 65–99)
Potassium: 3.8 mmol/L (ref 3.5–5.2)
Sodium: 149 mmol/L — ABNORMAL HIGH (ref 134–144)
Total Protein: 6.4 g/dL (ref 6.0–8.5)

## 2020-02-08 MED ORDER — IRON 325 (65 FE) MG PO TABS
325.0000 mg | ORAL_TABLET | ORAL | 2 refills | Status: DC
Start: 2020-02-08 — End: 2021-02-26

## 2020-02-08 MED ORDER — SIMVASTATIN 20 MG PO TABS: ORAL_TABLET | ORAL | 1 refills | Status: DC

## 2020-02-08 NOTE — Progress Notes (Signed)
Subjective:  Patient ID: Jeffrey Downs, male    DOB: [birth year]  Age: 69 y.o. MRN: [id number]  CC: Medical Management of Chronic Issues   HPI Abbott Jasinski Honeycutt presents for  follow-up of hypertension. Patient has no history of headache chest pain or shortness of breath or recent cough. Patient also denies symptoms of TIA such as focal numbness or weakness. Patient denies side effects from medication. States taking it regularly.  Patient in for follow-up of elevated cholesterol. Doing well without complaints on current medication. Denies side effects of statin including myalgia and arthralgia and nausea. Also in today for liver function testing. Currently no chest pain, shortness of breath or other cardiovascular related symptoms noted.  Patient also has been noted to have iron insufficiency anemia.  He continues to take iron weekly.  He is due to have CBC to cover that today.  History Lauren has a past medical history of Adenomatous colon polyp, Anemia, Hyperlipidemia, Hypertension, Kidney stones, and Vitamin D deficiency.   He has a past surgical history that includes Tonsillectomy; Colonoscopy w/ polypectomy; Colonoscopy; Polypectomy; and birth mark removed as infant.   His family history includes Cancer in his father and maternal uncle; Hyperlipidemia in his sister; Hypertension in his mother and sister; Lung cancer in his father; Thyroid disease in his mother.He reports that he has never smoked. He has never used smokeless tobacco. He reports that he does not drink alcohol and does not use drugs.  Current Outpatient Medications on File Prior to Visit  Medication Sig Dispense Refill  . amLODipine (NORVASC) 10 MG tablet TAKE 1 TABLET BY MOUTH DAILY 90 tablet 0  . aspirin 81 MG chewable tablet Chew 81 mg by mouth daily.    . Cholecalciferol (D3 SUPER STRENGTH) 2000 units CAPS TAKE 2 CAPSULES  (4000  UNITS) EVERY DAY 180 capsule 1  . lisinopril (ZESTRIL) 20 MG tablet TAKE 1 TABLET BY MOUTH  EVERY DAY 90 tablet 0  . Omega-3 Fatty Acids (FISH OIL) 1000 MG CAPS Take 1,000 mg by mouth every other day.    Marland Kitchen OVER THE COUNTER MEDICATION Centrum Silver 50+ Mens     No current facility-administered medications on file prior to visit.    ROS Review of Systems  Constitutional: Negative for fever.  Respiratory: Negative for shortness of breath.   Cardiovascular: Negative for chest pain.  Musculoskeletal: Negative for arthralgias.  Skin: Negative for rash.    Objective:  BP 136/78   Pulse 66   Temp 97.6 F (36.4 C) (Temporal)   Resp 20   Ht 5' 11"  (1.803 m)   Wt 190 lb (86.2 kg)   SpO2 100%   BMI 26.50 kg/m   BP Readings from Last 3 Encounters:  02/08/20 136/78  08/08/19 131/77  04/05/19 116/68    Wt Readings from Last 3 Encounters:  02/08/20 190 lb (86.2 kg)  08/08/19 193 lb (87.5 kg)  04/05/19 192 lb 9.6 oz (87.4 kg)     Physical Exam Vitals reviewed.  Constitutional:      Appearance: He is well-developed.  HENT:     Head: Normocephalic and atraumatic.     Right Ear: External ear normal.     Left Ear: External ear normal.     Mouth/Throat:     Pharynx: No oropharyngeal exudate or posterior oropharyngeal erythema.  Eyes:     Pupils: Pupils are equal, round, and reactive to light.  Cardiovascular:     Rate and Rhythm: Normal rate and regular rhythm.  Heart sounds: No murmur heard.   Pulmonary:     Effort: No respiratory distress.     Breath sounds: Normal breath sounds.  Musculoskeletal:     Cervical back: Normal range of motion and neck supple.  Neurological:     Mental Status: He is alert and oriented to person, place, and time.       Assessment & Plan:   Kasen was seen today for medical management of chronic issues.  Diagnoses and all orders for this visit:  Primary hypertension -     CMP14+EGFR  Mixed hyperlipidemia -     simvastatin (ZOCOR) 20 MG tablet; TAKE ONE-HALF TABLET BY MOUTH EVERY DAY AT 6 PM -     CMP14+EGFR -      Lipid panel  Iron deficiency anemia, unspecified iron deficiency anemia type -     CBC with Differential/Platelet -     CMP14+EGFR  Need for immunization against influenza -     Flu Vaccine QUAD High Dose(Fluad)  Other orders -     Ferrous Sulfate (IRON) 325 (65 Fe) MG TABS; Take 1 tablet (325 mg total) by mouth once a week.   Allergies as of 02/08/2020   No Known Allergies     Medication List       Accurate as of February 08, 2020 11:59 PM. If you have any questions, ask your nurse or doctor.        amLODipine 10 MG tablet Commonly known as: NORVASC TAKE 1 TABLET BY MOUTH DAILY   aspirin 81 MG chewable tablet Chew 81 mg by mouth daily.   Cholecalciferol 50 MCG (2000 UT) Caps Commonly known as: D3 Super Strength TAKE 2 CAPSULES  (4000  UNITS) EVERY DAY   Fish Oil 1000 MG Caps Take 1,000 mg by mouth every other day.   Iron 325 (65 Fe) MG Tabs Take 1 tablet (325 mg total) by mouth once a week.   lisinopril 20 MG tablet Commonly known as: ZESTRIL TAKE 1 TABLET BY MOUTH EVERY DAY   OVER THE COUNTER MEDICATION Centrum Silver 50+ Mens   simvastatin 20 MG tablet Commonly known as: ZOCOR TAKE ONE-HALF TABLET BY MOUTH EVERY DAY AT 6 PM       Meds ordered this encounter  Medications  . simvastatin (ZOCOR) 20 MG tablet    Sig: TAKE ONE-HALF TABLET BY MOUTH EVERY DAY AT 6 PM    Dispense:  45 tablet    Refill:  1  . Ferrous Sulfate (IRON) 325 (65 Fe) MG TABS    Sig: Take 1 tablet (325 mg total) by mouth once a week.    Dispense:  30 tablet    Refill:  2    Pending labs, the patient should continue medications as is.  Follow-up: Return in about 6 months (around 08/07/2020) for [facility] physical.  Claretta Fraise, M.D.

## 2020-02-10 NOTE — Progress Notes (Signed)
Hello Curren,  Your lab result is normal and/or stable.Some minor variations that are not significant are commonly marked abnormal, but do not represent any medical problem for you.  Best regards, Melissa Tomaselli, M.D.

## 2020-02-11 ENCOUNTER — Encounter: Payer: Self-pay | Admitting: Family Medicine

## 2020-04-29 ENCOUNTER — Other Ambulatory Visit: Payer: Self-pay | Admitting: *Deleted

## 2020-04-29 DIAGNOSIS — I10 Essential (primary) hypertension: Secondary | ICD-10-CM

## 2020-04-29 MED ORDER — LISINOPRIL 20 MG PO TABS: ORAL_TABLET | ORAL | 1 refills | Status: DC

## 2020-04-30 ENCOUNTER — Other Ambulatory Visit: Payer: Self-pay | Admitting: *Deleted

## 2020-04-30 DIAGNOSIS — E782 Mixed hyperlipidemia: Secondary | ICD-10-CM

## 2020-04-30 MED ORDER — SIMVASTATIN 20 MG PO TABS: ORAL_TABLET | ORAL | 0 refills | Status: DC

## 2020-05-01 ENCOUNTER — Other Ambulatory Visit: Payer: Self-pay | Admitting: *Deleted

## 2020-05-01 DIAGNOSIS — I10 Essential (primary) hypertension: Secondary | ICD-10-CM

## 2020-05-01 MED ORDER — AMLODIPINE BESYLATE 10 MG PO TABS: 10.0000 mg | ORAL_TABLET | Freq: Every day | ORAL | 1 refills | Status: DC

## 2020-08-07 ENCOUNTER — Ambulatory Visit: Payer: Medicare Other | Admitting: Family Medicine

## 2020-08-07 ENCOUNTER — Encounter: Payer: Self-pay | Admitting: Family Medicine

## 2020-08-07 ENCOUNTER — Ambulatory Visit (INDEPENDENT_AMBULATORY_CARE_PROVIDER_SITE_OTHER): Payer: Medicare Other | Admitting: Family Medicine

## 2020-08-07 ENCOUNTER — Other Ambulatory Visit: Payer: Self-pay

## 2020-08-07 VITALS — BP 116/58 | HR 60 | Temp 97.6°F | Ht 71.0 in | Wt 189.4 lb

## 2020-08-07 DIAGNOSIS — E782 Mixed hyperlipidemia: Secondary | ICD-10-CM

## 2020-08-07 DIAGNOSIS — I10 Essential (primary) hypertension: Secondary | ICD-10-CM | POA: Diagnosis not present

## 2020-08-07 DIAGNOSIS — Z0001 Encounter for general adult medical examination with abnormal findings: Secondary | ICD-10-CM

## 2020-08-07 DIAGNOSIS — Z0000 Encounter for general adult medical examination without abnormal findings: Secondary | ICD-10-CM

## 2020-08-07 DIAGNOSIS — Z125 Encounter for screening for malignant neoplasm of prostate: Secondary | ICD-10-CM

## 2020-08-07 DIAGNOSIS — E559 Vitamin D deficiency, unspecified: Secondary | ICD-10-CM | POA: Diagnosis not present

## 2020-08-07 LAB — URINALYSIS
Bilirubin, UA: NEGATIVE
Glucose, UA: NEGATIVE
Nitrite, UA: NEGATIVE
Specific Gravity, UA: 1.025 (ref 1.005–1.030)
Urobilinogen, Ur: 0.2 mg/dL (ref 0.2–1.0)
pH, UA: 5.5 (ref 5.0–7.5)

## 2020-08-07 MED ORDER — ROSUVASTATIN CALCIUM 10 MG PO TABS: 10.0000 mg | ORAL_TABLET | Freq: Every day | ORAL | 1 refills | Status: DC

## 2020-08-07 NOTE — Progress Notes (Signed)
Subjective:  Patient ID: Jeffrey Downs, male    DOB: 1950-05-06  Age: 70 y.o. MRN: [id number]  CC: Annual Exam   HPI Jeffrey Downs Test presents for Annual exam. Concerned about whether he should take ASA 81 mg any more.  Depression screen [facility] 2/9 08/07/2020 02/08/2020 08/08/2019  Decreased Interest 0 0 0  Jeffrey Downs, Depressed, Hopeless 0 0 0  PHQ - 2 Score 0 0 0    History Jeffrey Downs has a past medical history of Adenomatous colon polyp, Anemia, Hyperlipidemia, Hypertension, Kidney stones, and Vitamin D deficiency.   He has a past surgical history that includes Tonsillectomy; Colonoscopy w/ polypectomy; Colonoscopy; Polypectomy; and birth mark removed as infant.   His family history includes Cancer in his father and maternal uncle; Hyperlipidemia in his sister; Hypertension in his mother and sister; Lung cancer in his father; Thyroid disease in his mother.He reports that he has never smoked. He has never used smokeless tobacco. He reports that he does not drink alcohol and does not use drugs.    ROS Review of Systems  Constitutional: Negative.   HENT: Negative.   Eyes: Negative for visual disturbance.  Respiratory: Negative for cough and shortness of breath.   Cardiovascular: Negative for chest pain and leg swelling.  Gastrointestinal: Negative for abdominal pain, diarrhea, nausea and vomiting.  Genitourinary: Negative for difficulty urinating.  Musculoskeletal: Negative for arthralgias and myalgias.  Skin: Negative for rash.  Neurological: Negative for headaches.  Psychiatric/Behavioral: Negative for sleep disturbance.    Objective:  BP (!) 116/58   Pulse 60   Temp 97.6 F (36.4 C)   Ht 5' 11"  (1.803 m)   Wt 189 lb 6.4 oz (85.9 kg)   SpO2 97%   BMI 26.42 kg/m   BP Readings from Last 3 Encounters:  08/07/20 (!) 116/58  02/08/20 136/78  08/08/19 131/77    Wt Readings from Last 3 Encounters:  08/07/20 189 lb 6.4 oz (85.9 kg)  02/08/20 190 lb (86.2 kg)  08/08/19 193 lb  (87.5 kg)     Physical Exam Constitutional:      Appearance: He is well-developed.  HENT:     Head: Normocephalic and atraumatic.  Eyes:     Pupils: Pupils are equal, round, and reactive to light.  Neck:     Thyroid: No thyromegaly.     Trachea: No tracheal deviation.  Cardiovascular:     Rate and Rhythm: Normal rate and regular rhythm.     Heart sounds: Normal heart sounds. No murmur heard. No friction rub. No gallop.   Pulmonary:     Breath sounds: Normal breath sounds. No wheezing or rales.  Abdominal:     General: Bowel sounds are normal. There is no distension.     Palpations: Abdomen is soft. There is no mass.     Tenderness: There is no abdominal tenderness.     Hernia: There is no hernia in the left inguinal area.  Genitourinary:    Penis: Normal.      Testes: Normal.  Musculoskeletal:        General: Normal range of motion.     Cervical back: Normal range of motion.  Lymphadenopathy:     Cervical: No cervical adenopathy.  Skin:    General: Skin is warm and dry.  Neurological:     Mental Status: He is alert and oriented to person, place, and time.       Assessment & Plan:   Sion was seen today for annual exam.  Diagnoses and all orders for this visit:  Well adult exam -     CBC with Differential/Platelet -     CMP14+EGFR -     Lipid panel -     Urinalysis  Mixed hyperlipidemia -     CBC with Differential/Platelet -     CMP14+EGFR -     Lipid panel  Primary hypertension -     CBC with Differential/Platelet -     CMP14+EGFR -     Lipid panel -     Urinalysis  Vitamin D deficiency -     CBC with Differential/Platelet -     CMP14+EGFR -     Lipid panel -     VITAMIN D 25 Hydroxy (Vit-D Deficiency, Fractures)  Screening for prostate cancer -     PSA, total and free  Other orders -     rosuvastatin (CRESTOR) 10 MG tablet; Take 1 tablet (10 mg total) by mouth daily. For cholesterol       I have discontinued Jeffrey Downs's aspirin  and simvastatin. I am also having him start on rosuvastatin. Additionally, I am having him maintain his Fish Oil, OVER THE COUNTER MEDICATION, Cholecalciferol, Iron, lisinopril, and amLODipine.  Allergies as of 08/07/2020   No Known Allergies     Medication List       Accurate as of Aug 07, 2020 10:05 AM. If you have any questions, ask your nurse or doctor.        STOP taking these medications   aspirin 81 MG chewable tablet Stopped by: Claretta Fraise, MD   simvastatin 20 MG tablet Commonly known as: ZOCOR Stopped by: Claretta Fraise, MD     TAKE these medications   amLODipine 10 MG tablet Commonly known as: NORVASC Take 1 tablet (10 mg total) by mouth daily.   Cholecalciferol 50 MCG (2000 UT) Caps Commonly known as: D3 Super Strength TAKE 2 CAPSULES  (4000  UNITS) EVERY DAY   Fish Oil 1000 MG Caps Take 1,000 mg by mouth every other day.   Iron 325 (65 Fe) MG Tabs Take 1 tablet (325 mg total) by mouth once a week.   lisinopril 20 MG tablet Commonly known as: ZESTRIL TAKE 1 TABLET BY MOUTH EVERY DAY   OVER THE COUNTER MEDICATION Centrum Silver 50+ Mens   rosuvastatin 10 MG tablet Commonly known as: Crestor Take 1 tablet (10 mg total) by mouth daily. For cholesterol Started by: Claretta Fraise, MD        Follow-up: Return in about 6 months (around 02/07/2021) for hypertension, cholesterol.  Claretta Fraise, M.D.

## 2020-08-08 LAB — CBC WITH DIFFERENTIAL/PLATELET
Basophils Absolute: 0.1 10*3/uL (ref 0.0–0.2)
Basos: 1 %
EOS (ABSOLUTE): 0.3 10*3/uL (ref 0.0–0.4)
Eos: 6 %
Hematocrit: 45.9 % (ref 37.5–51.0)
Hemoglobin: 15.7 g/dL (ref 13.0–17.7)
Immature Grans (Abs): 0 10*3/uL (ref 0.0–0.1)
Immature Granulocytes: 0 %
Lymphocytes Absolute: 1.5 10*3/uL (ref 0.7–3.1)
Lymphs: 30 %
MCH: 30.5 pg (ref 26.6–33.0)
MCHC: 34.2 g/dL (ref 31.5–35.7)
MCV: 89 fL (ref 79–97)
Monocytes Absolute: 0.6 10*3/uL (ref 0.1–0.9)
Monocytes: 11 %
Neutrophils Absolute: 2.6 10*3/uL (ref 1.4–7.0)
Neutrophils: 52 %
Platelets: 176 10*3/uL (ref 150–450)
RBC: 5.14 x10E6/uL (ref 4.14–5.80)
RDW: 14.2 % (ref 11.6–15.4)
WBC: 5 10*3/uL (ref 3.4–10.8)

## 2020-08-08 LAB — CMP14+EGFR
ALT: 16 IU/L (ref 0–44)
AST: 22 IU/L (ref 0–40)
Albumin/Globulin Ratio: 2 (ref 1.2–2.2)
Albumin: 4.4 g/dL (ref 3.8–4.8)
Alkaline Phosphatase: 97 IU/L (ref 44–121)
BUN/Creatinine Ratio: 18 (ref 10–24)
BUN: 23 mg/dL (ref 8–27)
Bilirubin Total: 1.3 mg/dL — ABNORMAL HIGH (ref 0.0–1.2)
CO2: 21 mmol/L (ref 20–29)
Calcium: 9 mg/dL (ref 8.6–10.2)
Chloride: 108 mmol/L — ABNORMAL HIGH (ref 96–106)
Creatinine, Ser: 1.25 mg/dL (ref 0.76–1.27)
Globulin, Total: 2.2 g/dL (ref 1.5–4.5)
Glucose: 86 mg/dL (ref 65–99)
Potassium: 3.9 mmol/L (ref 3.5–5.2)
Sodium: 148 mmol/L — ABNORMAL HIGH (ref 134–144)
Total Protein: 6.6 g/dL (ref 6.0–8.5)
eGFR: 62 mL/min/{1.73_m2} (ref >59)

## 2020-08-08 LAB — LIPID PANEL
Chol/HDL Ratio: 2.2 ratio (ref 0.0–5.0)
Cholesterol, Total: 171 mg/dL (ref 100–199)
HDL: 79 mg/dL (ref >39)
LDL Chol Calc (NIH): 81 mg/dL (ref 0–99)
Triglycerides: 56 mg/dL (ref 0–149)
VLDL Cholesterol Cal: 11 mg/dL (ref 5–40)

## 2020-08-08 LAB — VITAMIN D 25 HYDROXY (VIT D DEFICIENCY, FRACTURES): Vit D, 25-Hydroxy: 54.2 ng/mL (ref 30.0–100.0)

## 2020-08-08 LAB — PSA, TOTAL AND FREE
PSA, Free Pct: 41.3 %
PSA, Free: 0.62 ng/mL
Prostate Specific Ag, Serum: 1.5 ng/mL (ref 0.0–4.0)

## 2020-08-12 ENCOUNTER — Telehealth: Payer: Self-pay

## 2020-08-12 ENCOUNTER — Other Ambulatory Visit: Payer: Self-pay | Admitting: *Deleted

## 2020-08-12 DIAGNOSIS — R824 Acetonuria: Secondary | ICD-10-CM

## 2020-08-12 NOTE — Telephone Encounter (Signed)
Spoke with patient. Advised to come in for additional lab test, test requested could not be added to existing blood work.

## 2020-08-15 ENCOUNTER — Other Ambulatory Visit: Payer: Self-pay

## 2020-08-15 ENCOUNTER — Other Ambulatory Visit: Payer: Medicare Other

## 2020-08-15 DIAGNOSIS — R824 Acetonuria: Secondary | ICD-10-CM | POA: Diagnosis not present

## 2020-08-19 LAB — BETA-HYDROXYBUTYRIC ACID: Beta-Hydroxybutyrate: 0.5 mg/dL

## 2020-08-20 ENCOUNTER — Encounter: Payer: Self-pay | Admitting: Family Medicine

## 2020-10-15 ENCOUNTER — Other Ambulatory Visit: Payer: Self-pay | Admitting: *Deleted

## 2020-10-15 DIAGNOSIS — I10 Essential (primary) hypertension: Secondary | ICD-10-CM

## 2020-10-15 MED ORDER — AMLODIPINE BESYLATE 10 MG PO TABS: 10.0000 mg | ORAL_TABLET | Freq: Every day | ORAL | 0 refills | Status: DC

## 2020-10-15 MED ORDER — LISINOPRIL 20 MG PO TABS: ORAL_TABLET | ORAL | 0 refills | Status: DC

## 2020-10-15 NOTE — Addendum Note (Signed)
Addended by: Antonietta Barcelona D on: 10/15/2020 03:21 PM   Modules accepted: Orders

## 2021-01-14 ENCOUNTER — Telehealth: Payer: Self-pay | Admitting: Family Medicine

## 2021-01-14 ENCOUNTER — Other Ambulatory Visit: Payer: Self-pay | Admitting: Family Medicine

## 2021-01-14 DIAGNOSIS — I10 Essential (primary) hypertension: Secondary | ICD-10-CM

## 2021-01-14 NOTE — Telephone Encounter (Signed)
LVM for pt to rtn my call to schedule AWV with [facility]. Please schedule this appt if pt calls the office.  °

## 2021-01-16 DIAGNOSIS — Z23 Encounter for immunization: Secondary | ICD-10-CM | POA: Diagnosis not present

## 2021-01-20 ENCOUNTER — Telehealth: Payer: Self-pay | Admitting: Family Medicine

## 2021-01-20 NOTE — Telephone Encounter (Signed)
LVM for pt to rtn my call to schedule AWV with [facility].  

## 2021-02-03 ENCOUNTER — Other Ambulatory Visit: Payer: Self-pay | Admitting: *Deleted

## 2021-02-03 DIAGNOSIS — I10 Essential (primary) hypertension: Secondary | ICD-10-CM

## 2021-02-03 MED ORDER — AMLODIPINE BESYLATE 10 MG PO TABS: 10.0000 mg | ORAL_TABLET | Freq: Every day | ORAL | 0 refills | Status: DC

## 2021-02-11 ENCOUNTER — Other Ambulatory Visit: Payer: Self-pay

## 2021-02-11 ENCOUNTER — Ambulatory Visit (INDEPENDENT_AMBULATORY_CARE_PROVIDER_SITE_OTHER): Payer: Medicare Other | Admitting: Family Medicine

## 2021-02-11 ENCOUNTER — Encounter: Payer: Self-pay | Admitting: Family Medicine

## 2021-02-11 VITALS — BP 124/68 | HR 62 | Temp 97.6°F | Ht 71.0 in | Wt 188.4 lb

## 2021-02-11 DIAGNOSIS — E782 Mixed hyperlipidemia: Secondary | ICD-10-CM

## 2021-02-11 DIAGNOSIS — I10 Essential (primary) hypertension: Secondary | ICD-10-CM | POA: Diagnosis not present

## 2021-02-11 DIAGNOSIS — Z23 Encounter for immunization: Secondary | ICD-10-CM

## 2021-02-11 LAB — LIPID PANEL
Chol/HDL Ratio: 2.1 ratio (ref 0.0–5.0)
Cholesterol, Total: 145 mg/dL (ref 100–199)
HDL: 68 mg/dL (ref >39)
LDL Chol Calc (NIH): 64 mg/dL (ref 0–99)
Triglycerides: 61 mg/dL (ref 0–149)
VLDL Cholesterol Cal: 13 mg/dL (ref 5–40)

## 2021-02-11 LAB — CBC WITH DIFFERENTIAL/PLATELET
Basophils Absolute: 0.1 10*3/uL (ref 0.0–0.2)
Basos: 2 %
EOS (ABSOLUTE): 0.3 10*3/uL (ref 0.0–0.4)
Eos: 8 %
Hematocrit: 43 % (ref 37.5–51.0)
Hemoglobin: 14.5 g/dL (ref 13.0–17.7)
Immature Grans (Abs): 0 10*3/uL (ref 0.0–0.1)
Immature Granulocytes: 0 %
Lymphocytes Absolute: 1.3 10*3/uL (ref 0.7–3.1)
Lymphs: 33 %
MCH: 30.9 pg (ref 26.6–33.0)
MCHC: 33.7 g/dL (ref 31.5–35.7)
MCV: 92 fL (ref 79–97)
Monocytes Absolute: 0.5 10*3/uL (ref 0.1–0.9)
Monocytes: 12 %
Neutrophils Absolute: 1.8 10*3/uL (ref 1.4–7.0)
Neutrophils: 45 %
Platelets: 181 10*3/uL (ref 150–450)
RBC: 4.7 x10E6/uL (ref 4.14–5.80)
RDW: 13.2 % (ref 11.6–15.4)
WBC: 4 10*3/uL (ref 3.4–10.8)

## 2021-02-11 LAB — CMP14+EGFR
ALT: 18 IU/L (ref 0–44)
AST: 20 IU/L (ref 0–40)
Albumin/Globulin Ratio: 1.8 (ref 1.2–2.2)
Albumin: 4.2 g/dL (ref 3.7–4.7)
Alkaline Phosphatase: 82 IU/L (ref 44–121)
BUN/Creatinine Ratio: 10 (ref 10–24)
BUN: 11 mg/dL (ref 8–27)
Bilirubin Total: 1 mg/dL (ref 0.0–1.2)
CO2: 22 mmol/L (ref 20–29)
Calcium: 9 mg/dL (ref 8.6–10.2)
Chloride: 106 mmol/L (ref 96–106)
Creatinine, Ser: 1.15 mg/dL (ref 0.76–1.27)
Globulin, Total: 2.3 g/dL (ref 1.5–4.5)
Glucose: 88 mg/dL (ref 70–99)
Potassium: 3.7 mmol/L (ref 3.5–5.2)
Sodium: 142 mmol/L (ref 134–144)
Total Protein: 6.5 g/dL (ref 6.0–8.5)
eGFR: 68 mL/min/{1.73_m2} (ref >59)

## 2021-02-11 MED ORDER — ROSUVASTATIN CALCIUM 10 MG PO TABS: 10.0000 mg | ORAL_TABLET | Freq: Every day | ORAL | 3 refills | Status: DC

## 2021-02-11 MED ORDER — LISINOPRIL 20 MG PO TABS: 20.0000 mg | ORAL_TABLET | Freq: Every day | ORAL | 3 refills | Status: DC

## 2021-02-11 MED ORDER — AMLODIPINE BESYLATE 10 MG PO TABS: 10.0000 mg | ORAL_TABLET | Freq: Every day | ORAL | 3 refills | Status: DC

## 2021-02-11 NOTE — Addendum Note (Signed)
Addended by: Baldomero Lamy B on: 02/11/2021 09:50 AM   Modules accepted: Orders

## 2021-02-11 NOTE — Progress Notes (Signed)
Subjective:  Patient ID: Jeffrey Downs, male    DOB: 1951-03-24  Age: 70 y.o. MRN: [id number]  CC: Medical Management of Chronic Issues   HPI Jeffrey Downs presents for  follow-up of hypertension. Patient has no history of headache chest pain or shortness of breath or recent cough. Patient also denies symptoms of TIA such as focal numbness or weakness. Patient denies side effects from medication. States taking it regularly.  Marland Kitchen   History Jeffrey Downs has a past medical history of Adenomatous colon polyp, Anemia, Hyperlipidemia, Hypertension, Kidney stones, and Vitamin D deficiency.   He has a past surgical history that includes Tonsillectomy; Colonoscopy w/ polypectomy; Colonoscopy; Polypectomy; and birth mark removed as infant.   His family history includes Cancer in his father and maternal uncle; Hyperlipidemia in his sister; Hypertension in his mother and sister; Lung cancer in his father; Thyroid disease in his mother.He reports that he has never smoked. He has never used smokeless tobacco. He reports that he does not drink alcohol and does not use drugs.  Current Outpatient Medications on File Prior to Visit  Medication Sig Dispense Refill   Cholecalciferol (D3 SUPER STRENGTH) 2000 units CAPS TAKE 2 CAPSULES  (4000  UNITS) EVERY DAY 180 capsule 1   Ferrous Sulfate (IRON) 325 (65 Fe) MG TABS Take 1 tablet (325 mg total) by mouth once a week. 30 tablet 2   Omega-3 Fatty Acids (FISH OIL) 1000 MG CAPS Take 1,000 mg by mouth every other day.     OVER THE COUNTER MEDICATION Centrum Silver 50+ Mens     No current facility-administered medications on file prior to visit.    ROS Review of Systems  Constitutional:  Negative for fever.  Respiratory:  Negative for shortness of breath.   Cardiovascular:  Negative for chest pain.  Musculoskeletal:  Negative for arthralgias.  Skin:  Negative for rash.   Objective:  BP 124/68   Pulse 62   Temp 97.6 F (36.4 C)   Ht 5' 11"  (1.803 m)   Wt  188 lb 6.4 oz (85.5 kg)   SpO2 95%   BMI 26.28 kg/m   BP Readings from Last 3 Encounters:  02/11/21 124/68  08/07/20 (!) 116/58  02/08/20 136/78    Wt Readings from Last 3 Encounters:  02/11/21 188 lb 6.4 oz (85.5 kg)  08/07/20 189 lb 6.4 oz (85.9 kg)  02/08/20 190 lb (86.2 kg)     Physical Exam Vitals reviewed.  Constitutional:      Appearance: He is well-developed.  HENT:     Head: Normocephalic and atraumatic.     Right Ear: External ear normal.     Left Ear: External ear normal.     Mouth/Throat:     Pharynx: No oropharyngeal exudate or posterior oropharyngeal erythema.  Eyes:     Pupils: Pupils are equal, round, and reactive to light.  Cardiovascular:     Rate and Rhythm: Normal rate and regular rhythm.     Heart sounds: No murmur heard. Pulmonary:     Effort: No respiratory distress.     Breath sounds: Normal breath sounds.  Musculoskeletal:     Cervical back: Normal range of motion and neck supple.  Neurological:     Mental Status: He is alert and oriented to person, place, and time.      Assessment & Plan:   Jeffrey Downs was seen today for medical management of chronic issues.  Diagnoses and all orders for this visit:  Mixed hyperlipidemia -  Lipid panel  Primary hypertension -     CBC with Differential/Platelet -     CMP14+EGFR  Essential hypertension -     amLODipine (NORVASC) 10 MG tablet; Take 1 tablet (10 mg total) by mouth daily. -     lisinopril (ZESTRIL) 20 MG tablet; Take 1 tablet (20 mg total) by mouth daily.  Other orders -     rosuvastatin (CRESTOR) 10 MG tablet; Take 1 tablet (10 mg total) by mouth daily. For cholesterol  Allergies as of 02/11/2021   No Known Allergies      Medication List        Accurate as of February 11, 2021  8:55 AM. If you have any questions, ask your nurse or doctor.          amLODipine 10 MG tablet Commonly known as: NORVASC Take 1 tablet (10 mg total) by mouth daily.   Cholecalciferol 50 MCG  (2000 UT) Caps Commonly known as: D3 Super Strength TAKE 2 CAPSULES  (4000  UNITS) EVERY DAY   Fish Oil 1000 MG Caps Take 1,000 mg by mouth every other day.   Iron 325 (65 Fe) MG Tabs Take 1 tablet (325 mg total) by mouth once a week.   lisinopril 20 MG tablet Commonly known as: ZESTRIL Take 1 tablet (20 mg total) by mouth daily.   OVER THE COUNTER MEDICATION Centrum Silver 50+ Mens   rosuvastatin 10 MG tablet Commonly known as: Crestor Take 1 tablet (10 mg total) by mouth daily. For cholesterol        Meds ordered this encounter  Medications   rosuvastatin (CRESTOR) 10 MG tablet    Sig: Take 1 tablet (10 mg total) by mouth daily. For cholesterol    Dispense:  90 tablet    Refill:  3   amLODipine (NORVASC) 10 MG tablet    Sig: Take 1 tablet (10 mg total) by mouth daily.    Dispense:  90 tablet    Refill:  3   lisinopril (ZESTRIL) 20 MG tablet    Sig: Take 1 tablet (20 mg total) by mouth daily.    Dispense:  90 tablet    Refill:  3     Continue current regimen  Follow-up: Return in about 6 months (around 08/11/2021) for [facility]ete physical.  Claretta Fraise, M.D.

## 2021-02-12 NOTE — Progress Notes (Signed)
Hello Martinez,  Your lab result is normal and/or stable.Some minor variations that are not significant are commonly marked abnormal, but do not represent any medical problem for you.  Best regards, Blayne Garlick, M.D.

## 2021-02-26 ENCOUNTER — Other Ambulatory Visit: Payer: Self-pay | Admitting: Family Medicine

## 2021-03-03 ENCOUNTER — Telehealth: Payer: Self-pay | Admitting: Family Medicine

## 2021-03-03 NOTE — Telephone Encounter (Signed)
No answer unable to leave a message for patient to call back and schedule Medicare Annual Wellness Visit (AWV) to be completed by video or phone.   Last AWV: 10/05/2018  Please schedule at anytime with [place]  45 minute appointment  Any questions, please contact me at [phone]

## 2021-03-13 ENCOUNTER — Telehealth: Payer: Self-pay | Admitting: Family Medicine

## 2021-04-03 ENCOUNTER — Ambulatory Visit (INDEPENDENT_AMBULATORY_CARE_PROVIDER_SITE_OTHER): Payer: No Typology Code available for payment source | Admitting: *Deleted

## 2021-04-03 DIAGNOSIS — Z0000 Encounter for general adult medical examination without abnormal findings: Secondary | ICD-10-CM

## 2021-04-03 NOTE — Progress Notes (Signed)
MEDICARE ANNUAL WELLNESS VISIT  04/03/2021  Telephone Visit Disclaimer This Medicare AWV was conducted by telephone due to national recommendations for restrictions regarding the COVID-19 Pandemic (e.g. social distancing).  I verified, using two identifiers, that I am speaking with Jeffrey Downs or their authorized healthcare agent. I discussed the limitations, risks, security, and privacy concerns of performing an evaluation and management service by telephone and the potential availability of an in-person appointment in the future. The patient expressed understanding and agreed to proceed.  Location of Patient: home Location of Provider (nurse):  office  Subjective:    Jeffrey Downs is a 71 y.o. male patient of Stacks, Cletus Gash, MD who had a Medicare Annual Wellness Visit today via telephone. Jeffrey Downs is Retired and lives with an adult companion. he has 0 children. he reports that he is socially active and Downs interact with friends/family regularly. he is minimally physically active and enjoys playing piano and volunteer work.  Patient Care Team: Claretta Fraise, MD as PCP - General (Family Medicine) Melina Schools, OD (Optometry)  Advanced Directives 04/03/2021 10/05/2018 09/11/2016 01/09/2014 12/26/2013  Downs Patient Have a Medical Advance Directive? No No No No No  Would patient like information on creating a medical advance directive? No - Patient declined No - Patient declined Yes (MAU/Ambulatory/Procedural Areas - Information given) No - patient declined information No - patient declined information    Hospital Utilization Over the Past 12 Months: # of hospitalizations or ER visits: 0 # of surgeries: 0  Review of Systems    Patient reports that his overall health is unchanged compared to last year.  History obtained from chart review and the patient  Patient Reported Readings (BP, Pulse, CBG, Weight, etc) none  Pain Assessment Pain : No/denies pain     Current  Medications & Allergies (verified) Allergies as of 04/03/2021   No Known Allergies      Medication List        Accurate as of April 03, 2021 10:52 AM. If you have any questions, ask your nurse or doctor.          amLODipine 10 MG tablet Commonly known as: NORVASC Take 1 tablet (10 mg total) by mouth daily.   Cholecalciferol 50 MCG (2000 UT) Caps Commonly known as: D3 Super Strength TAKE 2 CAPSULES  (4000  UNITS) EVERY DAY   FeroSul 325 (65 FE) MG tablet Generic drug: ferrous sulfate TAKE 1 TABLET BY MOUTH ONCE A WEEK   Fish Oil 1000 MG Caps Take 1,000 mg by mouth every other day.   lisinopril 20 MG tablet Commonly known as: ZESTRIL Take 1 tablet (20 mg total) by mouth daily.   OVER THE COUNTER MEDICATION Centrum Silver 50+ Mens   rosuvastatin 10 MG tablet Commonly known as: Crestor Take 1 tablet (10 mg total) by mouth daily. For cholesterol        History (reviewed): Past Medical History:  Diagnosis Date   Adenomatous colon polyp    Anemia    Hyperlipidemia    Hypertension    Kidney stones    Vitamin D deficiency    Past Surgical History:  Procedure Laterality Date   birth mark removed as infant     COLONOSCOPY     COLONOSCOPY W/ POLYPECTOMY     POLYPECTOMY     TONSILLECTOMY     Family History  Problem Relation Age of Onset   Cancer Father        lung   Lung  cancer Father    Hyperlipidemia Sister    Hypertension Sister    Hypertension Mother    Thyroid disease Mother    Cancer Maternal Uncle        some type of blood cancer- passed away Mar 25, 2019   Colon cancer Neg Hx    Colon polyps Neg Hx    Esophageal cancer Neg Hx    Stomach cancer Neg Hx    Rectal cancer Neg Hx    Social History   Socioeconomic History   Marital status: Single    Spouse name: Not on file   Number of children: 0   Years of education: 16   Highest education level: Bachelor's degree (e.g., BA, AB, BS)  Occupational History   Occupation: office supply dealer     Comment: retired  Tobacco Use   Smoking status: Never   Smokeless tobacco: Never  Vaping Use   Vaping Use: Never used  Substance and Sexual Activity   Alcohol use: No   Drug use: No   Sexual activity: Yes    Birth control/protection: Condom  Other Topics Concern   Not on file  Social History Narrative   Single   No children   No pets    Social Determinants of Radio broadcast assistant Strain: Low Risk    Difficulty of Paying Living Expenses: Not hard at all  Food Insecurity: No Food Insecurity   Worried About Charity fundraiser in the Last Year: Never true   [place] in the Last Year: Never true  Transportation Needs: No Transportation Needs   Lack of Transportation (Medical): No   Lack of Transportation (Non-Medical): No  Physical Activity: Insufficiently Active   Days of Exercise per Week: 2 days   Minutes of Exercise per Session: 30 min  Stress: No Stress Concern Present   Feeling of Stress : Not at all  Social Connections: Moderately Integrated   Frequency of Communication with Friends and Family: More than three times a week   Frequency of Social Gatherings with Friends and Family: More than three times a week   Attends Religious Services: More than 4 times per year   Active Member of [employer] or Organizations: Yes   Attends Archivist Meetings: 1 to 4 times per year   Marital Status: Never married    Activities of Daily Living In your present state of health, do you have any difficulty performing the following activities: 04/03/2021  Hearing? N  Vision? N  Difficulty concentrating or making decisions? N  Walking or climbing stairs? N  Dressing or bathing? N  Doing errands, shopping? N  Preparing Food and eating ? N  In the past six months, have you accidently leaked urine? N  Managing your Medications? N  Housekeeping or managing your Housekeeping? N  Some recent data might be hidden    Patient Education/ Literacy How often do you need to  have someone help you when you read instructions, pamphlets, or other written materials from your doctor or pharmacy?: 1 - Never What is the last grade level you completed in school?: 12  Exercise Current Exercise Habits: Home exercise routine, Type of exercise: stretching, Time (Minutes): 30, Frequency (Times/Week): 2, Weekly Exercise (Minutes/Week): 60, Intensity: Mild, Exercise limited by: None identified  Diet Patient reports consuming 3 meals a day and 1 snack(s) a day Patient reports that his primary diet is: Regular Patient reports that she Downs have regular access to food.   Depression  Screen PHQ 2/9 Scores 04/03/2021 02/11/2021 08/07/2020 02/08/2020 08/08/2019 02/08/2019 10/05/2018  PHQ - 2 Score 0 0 0 0 0 0 0     Fall Risk Fall Risk  04/03/2021 02/11/2021 08/07/2020 02/08/2020 08/08/2019  Falls in the past year? 1 0 0 0 0  Number falls in past yr: 0 - - - 0  Injury with Fall? 0 - - - 0  Risk for fall due to : History of fall(s) - - - No Fall Risks  Follow up Falls prevention discussed - - Falls evaluation completed Falls evaluation completed     Objective:  Jeffrey Downs seemed alert and oriented and he participated appropriately during our telephone visit.  Blood Pressure Weight BMI  BP Readings from Last 3 Encounters:  02/11/21 124/68  08/07/20 (!) 116/58  02/08/20 136/78   Wt Readings from Last 3 Encounters:  02/11/21 188 lb 6.4 oz (85.5 kg)  08/07/20 189 lb 6.4 oz (85.9 kg)  02/08/20 190 lb (86.2 kg)   BMI Readings from Last 1 Encounters:  02/11/21 26.28 kg/m    *Unable to obtain current vital signs, weight, and BMI due to telephone visit type  Hearing/Vision  Jeffrey Downs did not seem to have difficulty with hearing/understanding during the telephone conversation Reports that he has had a formal eye exam by an eye care professional within the past year Reports that he has not had a formal hearing evaluation within the past year *Unable to fully assess hearing and vision  during telephone visit type  Cognitive Function: 6CIT Screen 04/03/2021 10/05/2018  What Year? 0 points 0 points  What month? 0 points 0 points  What time? 0 points 0 points  Count back from 20 0 points 0 points  Months in reverse 0 points 0 points  Repeat phrase 0 points 0 points  Total Score 0 0   (Normal:0-7, Significant for Dysfunction: >8)  Normal Cognitive Function Screening: Yes   Immunization & Health Maintenance Record Immunization History  Administered Date(s) Administered   Fluad Quad(high Dose 65+) 02/08/2019, 02/08/2020   Influenza, High Dose Seasonal PF 12/30/2015, 12/30/2017   Influenza,inj,Quad PF,6+ Mos 12/26/2014, 12/29/2016   Pneumococcal Conjugate-13 12/26/2014   Pneumococcal Polysaccharide-23 12/30/2015   Tdap 01/29/2011, 02/11/2021   Zoster, Live 07/07/2013    Health Maintenance  Topic Date Due   COVID-19 Vaccine (1) Never done   Zoster Vaccines- Shingrix (1 of 2) Never done   INFLUENZA VACCINE  10/28/2020   COLONOSCOPY (Pts 45-77yrs Insurance coverage will need to be confirmed)  04/04/2022   TETANUS/TDAP  02/12/2031   Pneumonia Vaccine 43+ Years old  Completed   Hepatitis C Screening  Completed   HPV VACCINES  Aged Out       Assessment  This is a routine wellness examination for Jeffrey Downs.  Health Maintenance: Due or Overdue Health Maintenance Due  Topic Date Due   COVID-19 Vaccine (1) Never done   Zoster Vaccines- Shingrix (1 of 2) Never done   INFLUENZA VACCINE  10/28/2020    Jeffrey Downs not need a referral for Community Assistance: Care Management:   no Social Work:    no Prescription Assistance:  no Nutrition/Diabetes Education:  no   Plan:  Personalized Goals  Goals Addressed             This Visit's Progress    DIET - INCREASE WATER INTAKE   Not on track    Try to drink 6-8 glasses of water daily.  Exercise 150 minutes per week (moderate activity)   Not on track    Prevent falls         Personalized  Health Maintenance & Screening Recommendations  Advanced directives: has NO advanced directive - not interested in additional information Shingrix  Lung Cancer Screening Recommended: no (Low Dose CT Chest recommended if Age 4-80 years, 30 pack-year currently smoking OR have quit w/in past 15 years) Hepatitis C Screening recommended: no HIV Screening recommended: no  Advanced Directives: Written information was not prepared per patient's request.  Referrals & Orders No orders of the defined types were placed in this encounter.   Follow-up Plan Follow-up with Claretta Fraise, MD as planned on 08/19/21 Pt will get shingrix vaccine at next visit (noted in chart). Pt Downs not have advanced directives, but says he is looking into it, offered our packet but declined. Hearing good, vision good with contacts. Independent of all ADL's Avs Mailed to pt   I have personally reviewed and noted the following in the patients chart:   Medical and social history Use of alcohol, tobacco or illicit drugs  Current medications and supplements Functional ability and status Nutritional status Physical activity Advanced directives List of other physicians Hospitalizations, surgeries, and ER visits in previous 12 months Vitals Screenings to include cognitive, depression, and falls Referrals and appointments  In addition, I have reviewed and discussed with Audry Pili D Corlew certain preventive protocols, quality metrics, and best practice recommendations. A written personalized care plan for preventive services as well as general preventive health recommendations is available and can be mailed to the patient at his request.      Faylene Million CharmaineLPN  10/05/9782

## 2021-04-22 ENCOUNTER — Ambulatory Visit (INDEPENDENT_AMBULATORY_CARE_PROVIDER_SITE_OTHER): Payer: No Typology Code available for payment source | Admitting: Family Medicine

## 2021-04-22 ENCOUNTER — Encounter: Payer: Self-pay | Admitting: Family Medicine

## 2021-04-22 VITALS — BP 142/76 | HR 79 | Temp 98.9°F | Ht 71.0 in | Wt 187.0 lb

## 2021-04-22 DIAGNOSIS — R062 Wheezing: Secondary | ICD-10-CM

## 2021-04-22 DIAGNOSIS — I10 Essential (primary) hypertension: Secondary | ICD-10-CM

## 2021-04-22 DIAGNOSIS — E782 Mixed hyperlipidemia: Secondary | ICD-10-CM | POA: Diagnosis not present

## 2021-04-22 DIAGNOSIS — J069 Acute upper respiratory infection, unspecified: Secondary | ICD-10-CM | POA: Diagnosis not present

## 2021-04-22 DIAGNOSIS — R6889 Other general symptoms and signs: Secondary | ICD-10-CM | POA: Diagnosis not present

## 2021-04-22 DIAGNOSIS — D509 Iron deficiency anemia, unspecified: Secondary | ICD-10-CM | POA: Diagnosis not present

## 2021-04-22 MED ORDER — FERROUS SULFATE 325 (65 FE) MG PO TABS: 325.0000 mg | ORAL_TABLET | ORAL | 2 refills | Status: DC

## 2021-04-22 MED ORDER — DOXYCYCLINE HYCLATE 100 MG PO TABS: 100.0000 mg | ORAL_TABLET | Freq: Two times a day (BID) | ORAL | 0 refills | Status: AC

## 2021-04-22 MED ORDER — LISINOPRIL 20 MG PO TABS: 20.0000 mg | ORAL_TABLET | Freq: Every day | ORAL | 3 refills | Status: DC

## 2021-04-22 MED ORDER — GUAIFENESIN ER 600 MG PO TB12: 600.0000 mg | ORAL_TABLET | Freq: Two times a day (BID) | ORAL | 0 refills | Status: AC

## 2021-04-22 MED ORDER — METHYLPREDNISOLONE ACETATE 40 MG/ML IJ SUSP
40.0000 mg | Freq: Once | INTRAMUSCULAR | Status: AC
  Administered 2021-04-22: 11:00:00 40 mg via INTRAMUSCULAR

## 2021-04-22 MED ORDER — AMLODIPINE BESYLATE 10 MG PO TABS: 10.0000 mg | ORAL_TABLET | Freq: Every day | ORAL | 3 refills | Status: DC

## 2021-04-22 MED ORDER — ALBUTEROL SULFATE HFA 108 (90 BASE) MCG/ACT IN AERS: 2.0000 | INHALATION_SPRAY | Freq: Four times a day (QID) | RESPIRATORY_TRACT | 2 refills | Status: DC | PRN

## 2021-04-22 MED ORDER — ROSUVASTATIN CALCIUM 10 MG PO TABS: 10.0000 mg | ORAL_TABLET | Freq: Every day | ORAL | 3 refills | Status: DC

## 2021-04-22 NOTE — Progress Notes (Signed)
Subjective:  Patient ID: Jeffrey Downs, male    DOB: 1951-02-19, 71 y.o.   MRN: [id number]  Patient Care Team: Claretta Fraise, MD as PCP - General (Family Medicine) Melina Schools, OD (Optometry)   Chief Complaint:  Shortness of Breath (Chest congestion/Wheezing/Fatigue/Cough/Congestion/Sinus pressure/Home covid (-))   HPI: Jeffrey Downs is a 71 y.o. male presenting on 04/22/2021 for Shortness of Breath (Chest congestion/Wheezing/Fatigue/Cough/Congestion/Sinus pressure/Home covid (-))   Pt presents today with ongoing and worsening cough, congestion, fatigue, wheezing, and shortness of breath. Onset Sunday. Has been using coricidin HBP and Nyquil without relief of symptoms. Home COVID negative.   Pt also reports he needs refills on his regular medications sent in to [place]. He is on iron for IDA and tolerates well. Last CBC normal. He is on lisinopril and Norvasc for HTN and tolerates well. Has been out of Norvasc for 3 days. No chest pain, leg swelling, visual changes, or headaches. He is also on statin therapy for hyperlipidemia, no associated myalgias.   Shortness of Breath This is a new problem. The current episode started in the past 7 days. The problem occurs constantly. The problem has been gradually worsening. Associated symptoms include coryza, rhinorrhea, a sore throat, sputum production and wheezing. Pertinent negatives include no abdominal pain, chest pain, claudication, ear pain, fever, headaches, hemoptysis, leg pain, leg swelling, neck pain, orthopnea, PND, rash, swollen glands, syncope or vomiting. Nothing aggravates the symptoms. The patient has no known risk factors for DVT/PE. He has tried OTC cough suppressants for the symptoms. The treatment provided no relief.    Relevant past medical, surgical, family, and social history reviewed and updated as indicated.  Allergies and medications reviewed and updated. Data reviewed: Chart in Epic.   Past Medical  History:  Diagnosis Date   Adenomatous colon polyp    Anemia    Hyperlipidemia    Hypertension    Kidney stones    Vitamin D deficiency     Past Surgical History:  Procedure Laterality Date   birth mark removed as infant     COLONOSCOPY     COLONOSCOPY W/ POLYPECTOMY     POLYPECTOMY     TONSILLECTOMY      Social History   Socioeconomic History   Marital status: Single    Spouse name: Not on file   Number of children: 0   Years of education: 16   Highest education level: Bachelor's degree (e.g., BA, AB, BS)  Occupational History   Occupation: office supply dealer    Comment: retired  Tobacco Use   Smoking status: Never   Smokeless tobacco: Never  Vaping Use   Vaping Use: Never used  Substance and Sexual Activity   Alcohol use: No   Drug use: No   Sexual activity: Yes    Birth control/protection: Condom  Other Topics Concern   Not on file  Social History Narrative   Single   No children   No pets    Social Determinants of Radio broadcast assistant Strain: Low Risk    Difficulty of Paying Living Expenses: Not hard at all  Food Insecurity: No Food Insecurity   Worried About Charity fundraiser in the Last Year: Never true   [place] in the Last Year: Never true  Transportation Needs: No Transportation Needs   Lack of Transportation (Medical): No   Lack of Transportation (Non-Medical): No  Physical Activity: Insufficiently Active   Days of Exercise per  Week: 2 days   Minutes of Exercise per Session: 30 min  Stress: No Stress Concern Present   Feeling of Stress : Not at all  Social Connections: Moderately Integrated   Frequency of Communication with Friends and Family: More than three times a week   Frequency of Social Gatherings with Friends and Family: More than three times a week   Attends Religious Services: More than 4 times per year   Active Member of [employer] or Organizations: Yes   Attends Archivist Meetings: 1 to 4 times per year    Marital Status: Never married  Intimate Partner Violence: Not on file    Outpatient Encounter Medications as of 04/22/2021  Medication Sig   albuterol (VENTOLIN HFA) 108 (90 Base) MCG/ACT inhaler Inhale 2 puffs into the lungs every 6 (six) hours as needed for wheezing or shortness of breath.   Cholecalciferol (D3 SUPER STRENGTH) 2000 units CAPS TAKE 2 CAPSULES  (4000  UNITS) EVERY DAY   doxycycline (VIBRA-TABS) 100 MG tablet Take 1 tablet (100 mg total) by mouth 2 (two) times daily for 10 days. 1 po bid   guaiFENesin (MUCINEX) 600 MG 12 hr tablet Take 1 tablet (600 mg total) by mouth 2 (two) times daily for 10 days.   Omega-3 Fatty Acids (FISH OIL) 1000 MG CAPS Take 1,000 mg by mouth every other day.   OVER THE COUNTER MEDICATION Centrum Silver 50+ Mens   [DISCONTINUED] amLODipine (NORVASC) 10 MG tablet Take 1 tablet (10 mg total) by mouth daily.   [DISCONTINUED] FEROSUL 325 (65 Fe) MG tablet TAKE 1 TABLET BY MOUTH ONCE A WEEK   [DISCONTINUED] lisinopril (ZESTRIL) 20 MG tablet Take 1 tablet (20 mg total) by mouth daily.   [DISCONTINUED] rosuvastatin (CRESTOR) 10 MG tablet Take 1 tablet (10 mg total) by mouth daily. For cholesterol   amLODipine (NORVASC) 10 MG tablet Take 1 tablet (10 mg total) by mouth daily.   ferrous sulfate (FEROSUL) 325 (65 FE) MG tablet Take 1 tablet (325 mg total) by mouth once a week.   lisinopril (ZESTRIL) 20 MG tablet Take 1 tablet (20 mg total) by mouth daily.   rosuvastatin (CRESTOR) 10 MG tablet Take 1 tablet (10 mg total) by mouth daily. For cholesterol   Facility-Administered Encounter Medications as of 04/22/2021  Medication   methylPREDNISolone acetate (DEPO-MEDROL) injection 40 mg    No Known Allergies  Review of Systems  Constitutional:  Positive for activity change, appetite change, chills and fatigue. Negative for diaphoresis, fever and unexpected weight change.  HENT:  Positive for congestion, postnasal drip, rhinorrhea and sore throat. Negative  for dental problem, drooling, ear discharge, ear pain, facial swelling, hearing loss, mouth sores, nosebleeds, sinus pressure, sinus pain, sneezing, tinnitus, trouble swallowing and voice change.   Respiratory:  Positive for cough, sputum production, chest tightness, shortness of breath and wheezing. Negative for apnea, hemoptysis, choking and stridor.   Cardiovascular:  Negative for chest pain, palpitations, orthopnea, claudication, leg swelling, syncope and PND.  Gastrointestinal:  Negative for abdominal pain and vomiting.  Genitourinary:  Negative for decreased urine volume and difficulty urinating.  Musculoskeletal:  Negative for arthralgias, back pain, gait problem, joint swelling, myalgias, neck pain and neck stiffness.  Skin:  Negative for rash.  Neurological:  Negative for dizziness, tremors, seizures, syncope, facial asymmetry, speech difficulty, weakness, light-headedness, numbness and headaches.  Psychiatric/Behavioral:  Negative for confusion.   All other systems reviewed and are negative.      Objective:  BP (!) 142/76  Pulse 79    Temp 98.9 F (37.2 C)    Ht 5' 11"  (1.803 m)    Wt 187 lb (84.8 kg)    SpO2 94%    BMI 26.08 kg/m    Wt Readings from Last 3 Encounters:  04/22/21 187 lb (84.8 kg)  02/11/21 188 lb 6.4 oz (85.5 kg)  08/07/20 189 lb 6.4 oz (85.9 kg)    Physical Exam Vitals and nursing note reviewed.  Constitutional:      General: He is not in acute distress.    Appearance: Normal appearance. He is well-developed and overweight. He is not ill-appearing, toxic-appearing or diaphoretic.  HENT:     Head: Normocephalic and atraumatic.     Mouth/Throat:     Mouth: Mucous membranes are moist.     Pharynx: Oropharynx is clear.  Eyes:     Extraocular Movements: Extraocular movements intact.     Pupils: Pupils are equal, round, and reactive to light.  Cardiovascular:     Rate and Rhythm: Normal rate and regular rhythm.     Heart sounds: Normal heart sounds. No  murmur heard.   No friction rub. No gallop.  Pulmonary:     Effort: Pulmonary effort is normal.     Breath sounds: Wheezing (diffuse, expiratory) present.  Chest:     Chest wall: No mass, deformity, tenderness, crepitus or edema. There is no dullness to percussion.  Musculoskeletal:     Cervical back: Neck supple.     Right lower leg: No edema.     Left lower leg: No edema.  Skin:    General: Skin is warm and dry.     Capillary Refill: Capillary refill takes less than 2 seconds.  Neurological:     General: No focal deficit present.     Mental Status: He is alert and oriented to person, place, and time.  Psychiatric:        Mood and Affect: Mood normal.        Behavior: Behavior normal.        Thought Content: Thought content normal.        Judgment: Judgment normal.    Results for orders placed or performed in visit on 02/11/21  CBC with Differential/Platelet  Result Value Ref Range   WBC 4.0 3.4 - 10.8 x10E3/uL   RBC 4.70 4.14 - 5.80 x10E6/uL   Hemoglobin 14.5 13.0 - 17.7 g/dL   Hematocrit 43.0 37.5 - 51.0 %   MCV 92 79 - 97 fL   MCH 30.9 26.6 - 33.0 pg   MCHC 33.7 31.5 - 35.7 g/dL   RDW 13.2 11.6 - 15.4 %   Platelets 181 150 - 450 x10E3/uL   Neutrophils 45 Not Estab. %   Lymphs 33 Not Estab. %   Monocytes 12 Not Estab. %   Eos 8 Not Estab. %   Basos 2 Not Estab. %   Neutrophils Absolute 1.8 1.4 - 7.0 x10E3/uL   Lymphocytes Absolute 1.3 0.7 - 3.1 x10E3/uL   Monocytes Absolute 0.5 0.1 - 0.9 x10E3/uL   EOS (ABSOLUTE) 0.3 0.0 - 0.4 x10E3/uL   Basophils Absolute 0.1 0.0 - 0.2 x10E3/uL   Immature Granulocytes 0 Not Estab. %   Immature Grans (Abs) 0.0 0.0 - 0.1 x10E3/uL  CMP14+EGFR  Result Value Ref Range   Glucose 88 70 - 99 mg/dL   BUN 11 8 - 27 mg/dL   Creatinine, Ser 1.15 0.76 - 1.27 mg/dL   eGFR 68 >59 mL/min/1.73   BUN/Creatinine  Ratio 10 10 - 24   Sodium 142 134 - 144 mmol/L   Potassium 3.7 3.5 - 5.2 mmol/L   Chloride 106 96 - 106 mmol/L   CO2 22 20 - 29  mmol/L   Calcium 9.0 8.6 - 10.2 mg/dL   Total Protein 6.5 6.0 - 8.5 g/dL   Albumin 4.2 3.7 - 4.7 g/dL   Globulin, Total 2.3 1.5 - 4.5 g/dL   Albumin/Globulin Ratio 1.8 1.2 - 2.2   Bilirubin Total 1.0 0.0 - 1.2 mg/dL   Alkaline Phosphatase 82 44 - 121 IU/L   AST 20 0 - 40 IU/L   ALT 18 0 - 44 IU/L  Lipid panel  Result Value Ref Range   Cholesterol, Total 145 100 - 199 mg/dL   Triglycerides 61 0 - 149 mg/dL   HDL 68 >39 mg/dL   VLDL Cholesterol Cal 13 5 - 40 mg/dL   LDL Chol Calc (NIH) 64 0 - 99 mg/dL   Chol/HDL Ratio 2.1 0.0 - 5.0 ratio       Pertinent labs & imaging results that were available during my care of the patient were reviewed by me and considered in my medical decision making.  Assessment & Plan:  Tycen was seen today for shortness of breath.  Diagnoses and all orders for this visit:  URI with cough and congestion Will initiate doxycycline as prescribed. Burst with steroids in office as pt has significant wheezing. Albuterol and Mucinex as prescribed. Follow up in 2 weeks for reevaluation or sooner if warranted.  -     doxycycline (VIBRA-TABS) 100 MG tablet; Take 1 tablet (100 mg total) by mouth 2 (two) times daily for 10 days. 1 po bid -     albuterol (VENTOLIN HFA) 108 (90 Base) MCG/ACT inhaler; Inhale 2 puffs into the lungs every 6 (six) hours as needed for wheezing or shortness of breath. -     guaiFENesin (MUCINEX) 600 MG 12 hr tablet; Take 1 tablet (600 mg total) by mouth 2 (two) times daily for 10 days. -     methylPREDNISolone acetate (DEPO-MEDROL) injection 40 mg  Wheezing -     doxycycline (VIBRA-TABS) 100 MG tablet; Take 1 tablet (100 mg total) by mouth 2 (two) times daily for 10 days. 1 po bid -     albuterol (VENTOLIN HFA) 108 (90 Base) MCG/ACT inhaler; Inhale 2 puffs into the lungs every 6 (six) hours as needed for wheezing or shortness of breath. -     guaiFENesin (MUCINEX) 600 MG 12 hr tablet; Take 1 tablet (600 mg total) by mouth 2 (two) times daily  for 10 days.  Essential hypertension Has been well controlled. Will update labs and send in refills. DASH diet and exercise encouraged.  -     lisinopril (ZESTRIL) 20 MG tablet; Take 1 tablet (20 mg total) by mouth daily. -     amLODipine (NORVASC) 10 MG tablet; Take 1 tablet (10 mg total) by mouth daily. -     BMP8+EGFR -     Lipid panel  Mixed hyperlipidemia Will update labs and refill medications. Diet and exercise encouraged.  -     rosuvastatin (CRESTOR) 10 MG tablet; Take 1 tablet (10 mg total) by mouth daily. For cholesterol -     BMP8+EGFR -     Lipid panel  Iron deficiency anemia, unspecified iron deficiency anemia type Will refill medications and update labs today. If anemia profile normal, will stop iron repletion therapy.  -  ferrous sulfate (FEROSUL) 325 (65 FE) MG tablet; Take 1 tablet (325 mg total) by mouth once a week. -     Anemia Profile B     Continue all other maintenance medications.  Follow up plan: Return in about 2 weeks (around 05/06/2021), or if symptoms worsen or fail to improve.   Continue healthy lifestyle choices, including diet (rich in fruits, vegetables, and lean proteins, and low in salt and simple carbohydrates) and exercise (at least 30 minutes of moderate physical activity daily).  Educational handout given for URI  The above assessment and management plan was discussed with the patient. The patient verbalized understanding of and has agreed to the management plan. Patient is aware to call the clinic if they develop any new symptoms or if symptoms persist or worsen. Patient is aware when to return to the clinic for a follow-up visit. Patient educated on when it is appropriate to go to the emergency department.   Monia Pouch, FNP-C [facility] [phone]

## 2021-04-22 NOTE — Patient Instructions (Signed)
It appears that you have a viral upper respiratory infection (cold).  Cold symptoms can last up to 2 weeks.   ° °- Get plenty of rest and drink plenty of fluids. °- Try to breathe moist air. Use a cold mist humidifier. °- Consume warm fluids (soup or tea) to provide relief for a stuffy nose and to loosen phlegm. °- For cough and congestion you can use plain Mucinex, regular or max strength, follow box directions.  °- For nasal stuffiness, try saline nasal spray or a Neti Pot. You can use saline nasal spray 4 times daily. Do not use tap water in the Neti Pot, follow instructions on box for proper use. Afrin nasal spray can also be used but this product should not be used longer than 3 days or it will cause rebound nasal stuffiness (worsening nasal congestion). °- For sore throat pain relief: use chloraseptic spray, suck on throat lozenges, hard candy or popsicles; gargle with warm salt water (1/4 tsp. salt per 8 oz. of water); and eat soft, bland foods. °- For fever or aches and pains take tylenol or motrin as appropriate for age and weight.  °- Eat a well-balanced diet. If you cannot, ensure you are getting enough nutrients by taking a daily multivitamin. °- Avoid dairy products, as they can thicken phlegm. °- Avoid alcohol, as it impairs your body’s immune system. °- Change your toothbrush in 3 days.  ° °CONTACT YOUR DOCTOR IF YOU EXPERIENCE ANY OF THE FOLLOWING: °- High fever °- Ear pain °- Sinus-type headache °- Unusually severe cold symptoms °- Cough that gets worse while other cold symptoms improve °- Flare up of any chronic lung problem, such as asthma or COPD °- Your symptoms persist longer than 2 weeks ° °

## 2021-04-23 ENCOUNTER — Other Ambulatory Visit: Payer: Self-pay | Admitting: *Deleted

## 2021-04-23 DIAGNOSIS — R790 Abnormal level of blood mineral: Secondary | ICD-10-CM

## 2021-04-23 LAB — ANEMIA PROFILE B
Basophils Absolute: 0.1 10*3/uL (ref 0.0–0.2)
Basos: 2 %
EOS (ABSOLUTE): 0.3 10*3/uL (ref 0.0–0.4)
Eos: 7 %
Ferritin: 51 ng/mL (ref 30–400)
Folate: >20 ng/mL (ref >3.0)
Hematocrit: 44.8 % (ref 37.5–51.0)
Hemoglobin: 15.4 g/dL (ref 13.0–17.7)
Immature Grans (Abs): 0 10*3/uL (ref 0.0–0.1)
Immature Granulocytes: 0 %
Iron Saturation: >95 % (ref 15–55)
Iron: 309 ug/dL (ref 38–169)
Lymphocytes Absolute: 0.9 10*3/uL (ref 0.7–3.1)
Lymphs: 24 %
MCH: 31.7 pg (ref 26.6–33.0)
MCHC: 34.4 g/dL (ref 31.5–35.7)
MCV: 92 fL (ref 79–97)
Monocytes Absolute: 0.7 10*3/uL (ref 0.1–0.9)
Monocytes: 18 %
Neutrophils Absolute: 1.9 10*3/uL (ref 1.4–7.0)
Neutrophils: 49 %
Platelets: 176 10*3/uL (ref 150–450)
RBC: 4.86 x10E6/uL (ref 4.14–5.80)
RDW: 13.1 % (ref 11.6–15.4)
Retic Ct Pct: 1.6 % (ref 0.6–2.6)
Total Iron Binding Capacity: <326 ug/dL (ref 250–450)
UIBC: <17 ug/dL — ABNORMAL LOW (ref 111–343)
Vitamin B-12: 580 pg/mL (ref 232–1245)
WBC: 3.8 10*3/uL (ref 3.4–10.8)

## 2021-04-23 LAB — BMP8+EGFR
BUN/Creatinine Ratio: 11 (ref 10–24)
BUN: 13 mg/dL (ref 8–27)
CO2: 22 mmol/L (ref 20–29)
Calcium: 9.4 mg/dL (ref 8.6–10.2)
Chloride: 108 mmol/L — ABNORMAL HIGH (ref 96–106)
Creatinine, Ser: 1.17 mg/dL (ref 0.76–1.27)
Glucose: 91 mg/dL (ref 70–99)
Potassium: 4.1 mmol/L (ref 3.5–5.2)
Sodium: 144 mmol/L (ref 134–144)
eGFR: 67 mL/min/{1.73_m2} (ref >59)

## 2021-04-23 LAB — LIPID PANEL
Chol/HDL Ratio: 2 ratio (ref 0.0–5.0)
Cholesterol, Total: 172 mg/dL (ref 100–199)
HDL: 86 mg/dL (ref >39)
LDL Chol Calc (NIH): 73 mg/dL (ref 0–99)
Triglycerides: 69 mg/dL (ref 0–149)
VLDL Cholesterol Cal: 13 mg/dL (ref 5–40)

## 2021-05-07 ENCOUNTER — Other Ambulatory Visit: Payer: No Typology Code available for payment source

## 2021-05-07 DIAGNOSIS — R790 Abnormal level of blood mineral: Secondary | ICD-10-CM

## 2021-05-08 LAB — ANEMIA PROFILE B
Basophils Absolute: 0.1 10*3/uL (ref 0.0–0.2)
Basos: 1 %
EOS (ABSOLUTE): 0.3 10*3/uL (ref 0.0–0.4)
Eos: 5 %
Ferritin: 36 ng/mL (ref 30–400)
Folate: >20 ng/mL (ref >3.0)
Hematocrit: 43 % (ref 37.5–51.0)
Hemoglobin: 14.6 g/dL (ref 13.0–17.7)
Immature Grans (Abs): 0 10*3/uL (ref 0.0–0.1)
Immature Granulocytes: 0 %
Iron Saturation: 42 % (ref 15–55)
Iron: 108 ug/dL (ref 38–169)
Lymphocytes Absolute: 1.2 10*3/uL (ref 0.7–3.1)
Lymphs: 25 %
MCH: 31.3 pg (ref 26.6–33.0)
MCHC: 34 g/dL (ref 31.5–35.7)
MCV: 92 fL (ref 79–97)
Monocytes Absolute: 0.5 10*3/uL (ref 0.1–0.9)
Monocytes: 11 %
Neutrophils Absolute: 3 10*3/uL (ref 1.4–7.0)
Neutrophils: 58 %
Platelets: 176 10*3/uL (ref 150–450)
RBC: 4.66 x10E6/uL (ref 4.14–5.80)
RDW: 12.7 % (ref 11.6–15.4)
Retic Ct Pct: 1.4 % (ref 0.6–2.6)
Total Iron Binding Capacity: 258 ug/dL (ref 250–450)
UIBC: 150 ug/dL (ref 111–343)
Vitamin B-12: 504 pg/mL (ref 232–1245)
WBC: 5 10*3/uL (ref 3.4–10.8)

## 2021-05-09 ENCOUNTER — Other Ambulatory Visit: Payer: Self-pay | Admitting: Family Medicine

## 2021-05-09 DIAGNOSIS — J069 Acute upper respiratory infection, unspecified: Secondary | ICD-10-CM

## 2021-05-09 DIAGNOSIS — R062 Wheezing: Secondary | ICD-10-CM

## 2021-05-28 DIAGNOSIS — Z0100 Encounter for examination of eyes and vision without abnormal findings: Secondary | ICD-10-CM | POA: Diagnosis not present

## 2021-05-28 DIAGNOSIS — H5210 Myopia, unspecified eye: Secondary | ICD-10-CM | POA: Diagnosis not present

## 2021-08-19 ENCOUNTER — Ambulatory Visit (INDEPENDENT_AMBULATORY_CARE_PROVIDER_SITE_OTHER): Payer: No Typology Code available for payment source | Admitting: Family Medicine

## 2021-08-19 ENCOUNTER — Encounter: Payer: Self-pay | Admitting: Family Medicine

## 2021-08-19 VITALS — BP 123/64 | HR 55 | Temp 97.8°F | Ht 71.0 in | Wt 187.0 lb

## 2021-08-19 DIAGNOSIS — Z0001 Encounter for general adult medical examination with abnormal findings: Secondary | ICD-10-CM

## 2021-08-19 DIAGNOSIS — Z0000 Encounter for general adult medical examination without abnormal findings: Secondary | ICD-10-CM

## 2021-08-19 DIAGNOSIS — E782 Mixed hyperlipidemia: Secondary | ICD-10-CM

## 2021-08-19 DIAGNOSIS — E559 Vitamin D deficiency, unspecified: Secondary | ICD-10-CM | POA: Diagnosis not present

## 2021-08-19 DIAGNOSIS — R790 Abnormal level of blood mineral: Secondary | ICD-10-CM

## 2021-08-19 DIAGNOSIS — Z125 Encounter for screening for malignant neoplasm of prostate: Secondary | ICD-10-CM

## 2021-08-19 DIAGNOSIS — I10 Essential (primary) hypertension: Secondary | ICD-10-CM | POA: Diagnosis not present

## 2021-08-19 DIAGNOSIS — R6889 Other general symptoms and signs: Secondary | ICD-10-CM | POA: Diagnosis not present

## 2021-08-19 LAB — URINALYSIS
Bilirubin, UA: NEGATIVE
Glucose, UA: NEGATIVE
Ketones, UA: NEGATIVE
Nitrite, UA: NEGATIVE
Specific Gravity, UA: 1.025 (ref 1.005–1.030)
Urobilinogen, Ur: 0.2 mg/dL (ref 0.2–1.0)
pH, UA: 6 (ref 5.0–7.5)

## 2021-08-19 NOTE — Progress Notes (Signed)
Subjective:  Patient ID: Jeffrey Downs, male    DOB: 1950-08-02  Age: 71 y.o. MRN: [id number]  CC: Annual Exam   HPI Jeffrey Downs presents for annual exam. Concerned about iron being high.      08/19/2021    9:17 AM 04/22/2021   11:21 AM 04/03/2021   10:48 AM  Depression screen PHQ 2/9  Decreased Interest 0 0 0  Down, Depressed, Hopeless 0 0 0  PHQ - 2 Score 0 0 0  Altered sleeping  1   Tired, decreased energy  1   Change in appetite  0   Feeling bad or failure about yourself   0   Trouble concentrating  0   Moving slowly or fidgety/restless  0   Suicidal thoughts  0   PHQ-9 Score  2   Difficult doing work/chores  Not difficult at all     History Izaiyah has a past medical history of Adenomatous colon polyp, Anemia, Hyperlipidemia, Hypertension, Kidney stones, and Vitamin D deficiency.   He has a past surgical history that includes Tonsillectomy; Colonoscopy w/ polypectomy; Colonoscopy; Polypectomy; and birth mark removed as infant.   His family history includes Cancer in his father and maternal uncle; Hyperlipidemia in his sister; Hypertension in his mother and sister; Lung cancer in his father; Thyroid disease in his mother.He reports that he has never smoked. He has never used smokeless tobacco. He reports that he does not drink alcohol and does not use drugs.    ROS Review of Systems  Constitutional:  Negative for activity change, fatigue, fever and unexpected weight change.  HENT:  Negative for congestion, ear pain, hearing loss, postnasal drip and trouble swallowing.   Eyes:  Negative for pain and visual disturbance.  Respiratory:  Negative for cough, chest tightness and shortness of breath.   Cardiovascular:  Negative for chest pain, palpitations and leg swelling.  Gastrointestinal:  Negative for abdominal distention, abdominal pain, blood in stool, constipation, diarrhea, nausea and vomiting.  Endocrine: Negative for cold intolerance, heat intolerance and  polydipsia.  Genitourinary:  Negative for difficulty urinating, dysuria, flank pain, frequency and urgency.  Musculoskeletal:  Negative for arthralgias and joint swelling.  Skin:  Negative for color change, rash and wound.  Neurological:  Negative for dizziness, syncope, speech difficulty, weakness, light-headedness, numbness and headaches.  Hematological:  Does not bruise/bleed easily.  Psychiatric/Behavioral:  Negative for confusion, decreased concentration, dysphoric mood and sleep disturbance. The patient is not nervous/anxious.    Objective:  BP 123/64   Pulse (!) 55   Temp 97.8 F (36.6 C)   Ht 5' 11"  (1.803 m)   Wt 187 lb (84.8 kg)   SpO2 97%   BMI 26.08 kg/m   BP Readings from Last 3 Encounters:  08/19/21 123/64  04/22/21 (!) 142/76  02/11/21 124/68    Wt Readings from Last 3 Encounters:  08/19/21 187 lb (84.8 kg)  04/22/21 187 lb (84.8 kg)  02/11/21 188 lb 6.4 oz (85.5 kg)     Physical Exam Constitutional:      Appearance: He is well-developed.  HENT:     Head: Normocephalic and atraumatic.  Eyes:     Pupils: Pupils are equal, round, and reactive to light.  Neck:     Thyroid: No thyromegaly.     Trachea: No tracheal deviation.  Cardiovascular:     Rate and Rhythm: Normal rate and regular rhythm.     Heart sounds: Normal heart sounds. No murmur heard.   No  friction rub. No gallop.  Pulmonary:     Breath sounds: Normal breath sounds. No wheezing or rales.  Abdominal:     General: Bowel sounds are normal. There is no distension.     Palpations: Abdomen is soft. There is no mass.     Tenderness: There is no abdominal tenderness.     Hernia: There is no hernia in the left inguinal area.  Genitourinary:    Penis: Normal.      Testes: Normal.  Musculoskeletal:        General: Normal range of motion.     Cervical back: Normal range of motion.  Lymphadenopathy:     Cervical: No cervical adenopathy.  Skin:    General: Skin is warm and dry.  Neurological:      Mental Status: He is alert and oriented to person, place, and time.      Assessment & Plan:   Jeydan was seen today for annual exam.  Diagnoses and all orders for this visit:  Primary hypertension -     CBC with Differential/Platelet -     CMP14+EGFR  Mixed hyperlipidemia -     Lipid panel  Well adult exam -     Urinalysis  Abnormal iron saturation -     Iron, TIBC and Ferritin Panel  Screening for prostate cancer -     PSA, total and free  Vitamin D deficiency -     VITAMIN D 25 Hydroxy (Vit-D Deficiency, Fractures)       I am having Breeze D. Deerman maintain his Fish Oil, OVER THE COUNTER MEDICATION, Cholecalciferol, albuterol, rosuvastatin, lisinopril, ferrous sulfate, and amLODipine.  Allergies as of 08/19/2021   No Known Allergies      Medication List        Accurate as of Aug 19, 2021  9:43 AM. If you have any questions, ask your nurse or doctor.          albuterol 108 (90 Base) MCG/ACT inhaler Commonly known as: VENTOLIN HFA Inhale 2 puffs into the lungs every 6 (six) hours as needed for wheezing or shortness of breath.   amLODipine 10 MG tablet Commonly known as: NORVASC Take 1 tablet (10 mg total) by mouth daily.   Cholecalciferol 50 MCG (2000 UT) Caps Commonly known as: D3 Super Strength TAKE 2 CAPSULES  (4000  UNITS) EVERY DAY   ferrous sulfate 325 (65 FE) MG tablet Commonly known as: FeroSul Take 1 tablet (325 mg total) by mouth once a week.   Fish Oil 1000 MG Caps Take 1,000 mg by mouth every other day.   lisinopril 20 MG tablet Commonly known as: ZESTRIL Take 1 tablet (20 mg total) by mouth daily.   OVER THE COUNTER MEDICATION Centrum Silver 50+ Mens   rosuvastatin 10 MG tablet Commonly known as: Crestor Take 1 tablet (10 mg total) by mouth daily. For cholesterol         Follow-up: Return in about 6 months (around 02/19/2022).  Claretta Fraise, M.D.

## 2021-08-20 LAB — CBC WITH DIFFERENTIAL/PLATELET
Basophils Absolute: 0.1 10*3/uL (ref 0.0–0.2)
Basos: 1 %
EOS (ABSOLUTE): 0.2 10*3/uL (ref 0.0–0.4)
Eos: 5 %
Hematocrit: 44.2 % (ref 37.5–51.0)
Hemoglobin: 14.8 g/dL (ref 13.0–17.7)
Immature Grans (Abs): 0 10*3/uL (ref 0.0–0.1)
Immature Granulocytes: 0 %
Lymphocytes Absolute: 1.3 10*3/uL (ref 0.7–3.1)
Lymphs: 33 %
MCH: 29.5 pg (ref 26.6–33.0)
MCHC: 33.5 g/dL (ref 31.5–35.7)
MCV: 88 fL (ref 79–97)
Monocytes Absolute: 0.5 10*3/uL (ref 0.1–0.9)
Monocytes: 12 %
Neutrophils Absolute: 2 10*3/uL (ref 1.4–7.0)
Neutrophils: 49 %
Platelets: 174 10*3/uL (ref 150–450)
RBC: 5.01 x10E6/uL (ref 4.14–5.80)
RDW: 13.3 % (ref 11.6–15.4)
WBC: 4.1 10*3/uL (ref 3.4–10.8)

## 2021-08-20 LAB — CMP14+EGFR
ALT: 17 IU/L (ref 0–44)
AST: 24 IU/L (ref 0–40)
Albumin/Globulin Ratio: 2 (ref 1.2–2.2)
Albumin: 4.6 g/dL (ref 3.7–4.7)
Alkaline Phosphatase: 91 IU/L (ref 44–121)
BUN/Creatinine Ratio: 14 (ref 10–24)
BUN: 15 mg/dL (ref 8–27)
Bilirubin Total: 1.1 mg/dL (ref 0.0–1.2)
CO2: 20 mmol/L (ref 20–29)
Calcium: 9.4 mg/dL (ref 8.6–10.2)
Chloride: 106 mmol/L (ref 96–106)
Creatinine, Ser: 1.1 mg/dL (ref 0.76–1.27)
Globulin, Total: 2.3 g/dL (ref 1.5–4.5)
Glucose: 87 mg/dL (ref 70–99)
Potassium: 3.7 mmol/L (ref 3.5–5.2)
Sodium: 145 mmol/L — ABNORMAL HIGH (ref 134–144)
Total Protein: 6.9 g/dL (ref 6.0–8.5)
eGFR: 72 mL/min/{1.73_m2} (ref >59)

## 2021-08-20 LAB — IRON,TIBC AND FERRITIN PANEL
Ferritin: 45 ng/mL (ref 30–400)
Iron Saturation: 34 % (ref 15–55)
Iron: 118 ug/dL (ref 38–169)
Total Iron Binding Capacity: 343 ug/dL (ref 250–450)
UIBC: 225 ug/dL (ref 111–343)

## 2021-08-20 LAB — LIPID PANEL
Chol/HDL Ratio: 2.3 ratio (ref 0.0–5.0)
Cholesterol, Total: 166 mg/dL (ref 100–199)
HDL: 73 mg/dL (ref >39)
LDL Chol Calc (NIH): 79 mg/dL (ref 0–99)
Triglycerides: 77 mg/dL (ref 0–149)
VLDL Cholesterol Cal: 14 mg/dL (ref 5–40)

## 2021-08-20 LAB — PSA, TOTAL AND FREE
PSA, Free Pct: 40.6 %
PSA, Free: 0.73 ng/mL
Prostate Specific Ag, Serum: 1.8 ng/mL (ref 0.0–4.0)

## 2021-08-20 LAB — VITAMIN D 25 HYDROXY (VIT D DEFICIENCY, FRACTURES): Vit D, 25-Hydroxy: 40.2 ng/mL (ref 30.0–100.0)

## 2021-08-21 NOTE — Progress Notes (Signed)
Hello [name],  Your lab result is normal and/or stable.Some minor variations that are not significant are commonly marked abnormal, but do not represent any medical problem for you.  Best regards, Shelisha Gautier, M.D.

## 2022-01-08 DIAGNOSIS — I129 Hypertensive chronic kidney disease with stage 1 through stage 4 chronic kidney disease, or unspecified chronic kidney disease: Secondary | ICD-10-CM | POA: Diagnosis not present

## 2022-01-08 DIAGNOSIS — E663 Overweight: Secondary | ICD-10-CM | POA: Diagnosis not present

## 2022-01-08 DIAGNOSIS — E785 Hyperlipidemia, unspecified: Secondary | ICD-10-CM | POA: Diagnosis not present

## 2022-01-08 DIAGNOSIS — Z6825 Body mass index (BMI) 25.0-25.9, adult: Secondary | ICD-10-CM | POA: Diagnosis not present

## 2022-01-08 DIAGNOSIS — N182 Chronic kidney disease, stage 2 (mild): Secondary | ICD-10-CM | POA: Diagnosis not present

## 2022-01-08 DIAGNOSIS — Z008 Encounter for other general examination: Secondary | ICD-10-CM | POA: Diagnosis not present

## 2022-02-17 ENCOUNTER — Ambulatory Visit (INDEPENDENT_AMBULATORY_CARE_PROVIDER_SITE_OTHER): Payer: No Typology Code available for payment source | Admitting: Family Medicine

## 2022-02-17 ENCOUNTER — Encounter: Payer: Self-pay | Admitting: Family Medicine

## 2022-02-17 VITALS — BP 132/68 | HR 61 | Temp 97.7°F | Ht 71.0 in | Wt 187.4 lb

## 2022-02-17 DIAGNOSIS — E782 Mixed hyperlipidemia: Secondary | ICD-10-CM | POA: Diagnosis not present

## 2022-02-17 DIAGNOSIS — D509 Iron deficiency anemia, unspecified: Secondary | ICD-10-CM | POA: Diagnosis not present

## 2022-02-17 DIAGNOSIS — E559 Vitamin D deficiency, unspecified: Secondary | ICD-10-CM

## 2022-02-17 DIAGNOSIS — I10 Essential (primary) hypertension: Secondary | ICD-10-CM

## 2022-02-17 MED ORDER — LISINOPRIL 20 MG PO TABS: 20.0000 mg | ORAL_TABLET | Freq: Every day | ORAL | 2 refills | Status: DC

## 2022-02-17 MED ORDER — FERROUS SULFATE 325 (65 FE) MG PO TABS: 325.0000 mg | ORAL_TABLET | Freq: Every day | ORAL | 3 refills | Status: DC

## 2022-02-17 MED ORDER — ROSUVASTATIN CALCIUM 10 MG PO TABS: 10.0000 mg | ORAL_TABLET | Freq: Every day | ORAL | 2 refills | Status: DC

## 2022-02-17 NOTE — Progress Notes (Signed)
Subjective:  Patient ID: Jeffrey Downs, male    DOB: Aug 03, 1950  Age: 71 y.o. MRN: [id number]  CC: Medical Management of Chronic Issues   HPI Carter Kassel Kujawa presents for  follow-up of hypertension. Patient has no history of headache chest pain or shortness of breath or recent cough. Patient also denies symptoms of TIA such as focal numbness or weakness. Patient denies side effects from medication. States taking it regularly.  Vitamin D low in past. Taking OTC supplement   History Firas has a past medical history of Adenomatous colon polyp, Anemia, Hyperlipidemia, Hypertension, Kidney stones, and Vitamin D deficiency.   He has a past surgical history that includes Tonsillectomy; Colonoscopy w/ polypectomy; Colonoscopy; Polypectomy; and birth mark removed as infant.   His family history includes Cancer in his father and maternal uncle; Hyperlipidemia in his sister; Hypertension in his mother and sister; Lung cancer in his father; Thyroid disease in his mother.He reports that he has never smoked. He has never used smokeless tobacco. He reports that he does not drink alcohol and does not use drugs.  Current Outpatient Medications on File Prior to Visit  Medication Sig Dispense Refill   albuterol (VENTOLIN HFA) 108 (90 Base) MCG/ACT inhaler Inhale 2 puffs into the lungs every 6 (six) hours as needed for wheezing or shortness of breath. 8 g 2   amLODipine (NORVASC) 10 MG tablet Take 1 tablet (10 mg total) by mouth daily. 90 tablet 3   Cholecalciferol (D3 SUPER STRENGTH) 2000 units CAPS TAKE 2 CAPSULES  (4000  UNITS) EVERY DAY 180 capsule 1   Omega-3 Fatty Acids (FISH OIL) 1000 MG CAPS Take 1,000 mg by mouth every other day.     OVER THE COUNTER MEDICATION Centrum Silver 50+ Mens     No current facility-administered medications on file prior to visit.    ROS Review of Systems  Constitutional:  Negative for fever.  Respiratory:  Negative for shortness of breath.   Cardiovascular:   Negative for chest pain.  Musculoskeletal:  Negative for arthralgias.  Skin:  Negative for rash.    Objective:  BP 132/68   Pulse 61   Temp 97.7 F (36.5 C)   Ht _0  (1.803 m)   Wt 187 lb 6.4 oz (85 kg)   SpO2 97%   BMI 26.14 kg/m   BP Readings from Last 3 Encounters:  02/17/22 132/68  08/19/21 123/64  04/22/21 (!) 142/76    Wt Readings from Last 3 Encounters:  02/17/22 187 lb 6.4 oz (85 kg)  08/19/21 187 lb (84.8 kg)  04/22/21 187 lb (84.8 kg)     Physical Exam Vitals reviewed.  Constitutional:      Appearance: He is well-developed.  HENT:     Head: Normocephalic and atraumatic.     Right Ear: External ear normal.     Left Ear: External ear normal.     Mouth/Throat:     Pharynx: No oropharyngeal exudate or posterior oropharyngeal erythema.  Eyes:     Pupils: Pupils are equal, round, and reactive to light.  Cardiovascular:     Rate and Rhythm: Normal rate and regular rhythm.     Heart sounds: No murmur heard. Pulmonary:     Effort: No respiratory distress.     Breath sounds: Normal breath sounds.  Musculoskeletal:     Cervical back: Normal range of motion and neck supple.  Neurological:     Mental Status: He is alert and oriented to person, place, and time.  Assessment & Plan:   Jadon was seen today for medical management of chronic issues.  Diagnoses and all orders for this visit:  Vitamin D deficiency -     VITAMIN D 25 Hydroxy (Vit-D Deficiency, Fractures)  Essential hypertension -     lisinopril (ZESTRIL) 20 MG tablet; Take 1 tablet (20 mg total) by mouth daily. -     CBC with Differential/Platelet -     CMP14+EGFR -     Lipid panel  Mixed hyperlipidemia -     rosuvastatin (CRESTOR) 10 MG tablet; Take 1 tablet (10 mg total) by mouth daily. For cholesterol -     CBC with Differential/Platelet -     CMP14+EGFR -     Lipid panel  Iron deficiency anemia, unspecified iron deficiency anemia type -     ferrous sulfate (FEROSUL) 325  (65 FE) MG tablet; Take 1 tablet (325 mg total) by mouth daily.   Allergies as of 02/17/2022   No Known Allergies      Medication List        Accurate as of February 17, 2022  9:23 AM. If you have any questions, ask your nurse or doctor.          albuterol 108 (90 Base) MCG/ACT inhaler Commonly known as: VENTOLIN HFA Inhale 2 puffs into the lungs every 6 (six) hours as needed for wheezing or shortness of breath.   amLODipine 10 MG tablet Commonly known as: NORVASC Take 1 tablet (10 mg total) by mouth daily.   Cholecalciferol 50 MCG (2000 UT) Caps Commonly known as: D3 Super Strength TAKE 2 CAPSULES  (4000  UNITS) EVERY DAY   ferrous sulfate 325 (65 FE) MG tablet Commonly known as: FeroSul Take 1 tablet (325 mg total) by mouth daily. What changed: when to take this Changed by: Claretta Fraise, MD   Fish Oil 1000 MG Caps Take 1,000 mg by mouth every other day.   lisinopril 20 MG tablet Commonly known as: ZESTRIL Take 1 tablet (20 mg total) by mouth daily.   OVER THE COUNTER MEDICATION Centrum Silver 50+ Mens   rosuvastatin 10 MG tablet Commonly known as: Crestor Take 1 tablet (10 mg total) by mouth daily. For cholesterol        Meds ordered this encounter  Medications   lisinopril (ZESTRIL) 20 MG tablet    Sig: Take 1 tablet (20 mg total) by mouth daily.    Dispense:  90 tablet    Refill:  2   rosuvastatin (CRESTOR) 10 MG tablet    Sig: Take 1 tablet (10 mg total) by mouth daily. For cholesterol    Dispense:  90 tablet    Refill:  2   ferrous sulfate (FEROSUL) 325 (65 FE) MG tablet    Sig: Take 1 tablet (325 mg total) by mouth daily.    Dispense:  90 tablet    Refill:  3      Follow-up: Return in about 6 months (around 08/18/2022).  Claretta Fraise, M.D.

## 2022-02-18 LAB — CBC WITH DIFFERENTIAL/PLATELET
Basophils Absolute: 0.1 10*3/uL (ref 0.0–0.2)
Basos: 1 %
EOS (ABSOLUTE): 0.3 10*3/uL (ref 0.0–0.4)
Eos: 7 %
Hematocrit: 43 % (ref 37.5–51.0)
Hemoglobin: 14.5 g/dL (ref 13.0–17.7)
Immature Grans (Abs): 0 10*3/uL (ref 0.0–0.1)
Immature Granulocytes: 0 %
Lymphocytes Absolute: 1 10*3/uL (ref 0.7–3.1)
Lymphs: 26 %
MCH: 30.7 pg (ref 26.6–33.0)
MCHC: 33.7 g/dL (ref 31.5–35.7)
MCV: 91 fL (ref 79–97)
Monocytes Absolute: 0.5 10*3/uL (ref 0.1–0.9)
Monocytes: 12 %
Neutrophils Absolute: 1.9 10*3/uL (ref 1.4–7.0)
Neutrophils: 54 %
Platelets: 171 10*3/uL (ref 150–450)
RBC: 4.73 x10E6/uL (ref 4.14–5.80)
RDW: 13.1 % (ref 11.6–15.4)
WBC: 3.6 10*3/uL (ref 3.4–10.8)

## 2022-02-18 LAB — CMP14+EGFR
ALT: 13 IU/L (ref 0–44)
AST: 20 IU/L (ref 0–40)
Albumin/Globulin Ratio: 2 (ref 1.2–2.2)
Albumin: 4.1 g/dL (ref 3.8–4.8)
Alkaline Phosphatase: 97 IU/L (ref 44–121)
BUN/Creatinine Ratio: 13 (ref 10–24)
BUN: 13 mg/dL (ref 8–27)
Bilirubin Total: 0.9 mg/dL (ref 0.0–1.2)
CO2: 23 mmol/L (ref 20–29)
Calcium: 9.1 mg/dL (ref 8.6–10.2)
Chloride: 108 mmol/L — ABNORMAL HIGH (ref 96–106)
Creatinine, Ser: 1.02 mg/dL (ref 0.76–1.27)
Globulin, Total: 2.1 g/dL (ref 1.5–4.5)
Glucose: 89 mg/dL (ref 70–99)
Potassium: 3.8 mmol/L (ref 3.5–5.2)
Sodium: 144 mmol/L (ref 134–144)
Total Protein: 6.2 g/dL (ref 6.0–8.5)
eGFR: 78 mL/min/{1.73_m2} (ref >59)

## 2022-02-18 LAB — LIPID PANEL
Chol/HDL Ratio: 2.3 ratio (ref 0.0–5.0)
Cholesterol, Total: 162 mg/dL (ref 100–199)
HDL: 71 mg/dL (ref >39)
LDL Chol Calc (NIH): 77 mg/dL (ref 0–99)
Triglycerides: 74 mg/dL (ref 0–149)
VLDL Cholesterol Cal: 14 mg/dL (ref 5–40)

## 2022-02-18 LAB — VITAMIN D 25 HYDROXY (VIT D DEFICIENCY, FRACTURES): Vit D, 25-Hydroxy: 44 ng/mL (ref 30.0–100.0)

## 2022-02-23 NOTE — Progress Notes (Signed)
Hello Dalbert,  Your lab result is normal and/or stable.Some minor variations that are not significant are commonly marked abnormal, but do not represent any medical problem for you.  Best regards, Stanislawa Gaffin, M.D.

## 2022-04-15 ENCOUNTER — Encounter: Payer: Self-pay | Admitting: Gastroenterology

## 2022-05-04 ENCOUNTER — Encounter: Payer: Self-pay | Admitting: Gastroenterology

## 2022-05-11 ENCOUNTER — Ambulatory Visit (INDEPENDENT_AMBULATORY_CARE_PROVIDER_SITE_OTHER): Payer: No Typology Code available for payment source

## 2022-05-11 VITALS — Ht 70.0 in | Wt 187.0 lb

## 2022-05-11 DIAGNOSIS — Z0000 Encounter for general adult medical examination without abnormal findings: Secondary | ICD-10-CM

## 2022-05-11 NOTE — Progress Notes (Signed)
Subjective:   Jeffrey Downs is a 72 y.o. male who presents for Medicare Annual/Subsequent preventive examination. I connected with  Ariana D Dhaliwal on 05/11/22 by a audio enabled telemedicine application and verified that I am speaking with the correct person using two identifiers.  Patient Location: Home  Provider Location: Home Office  I discussed the limitations of evaluation and management by telemedicine. The patient expressed understanding and agreed to proceed.  Review of Systems     Cardiac Risk Factors include: advanced age (>34mn, >>79women);male gender;hypertension     Objective:    Today's Vitals   05/11/22 1422  Weight: 187 lb (84.8 kg)  Height: 5' 10"$  (1.778 m)   Body mass index is 26.83 kg/m.     05/11/2022    2:24 PM 04/03/2021   10:50 AM 10/05/2018    2:28 PM 09/11/2016    9:59 AM 01/09/2014   10:15 AM 12/26/2013    9:58 AM  Advanced Directives  Does Patient Have a Medical Advance Directive? No No No No No No  Would patient like information on creating a medical advance directive? No - Patient declined No - Patient declined No - Patient declined Yes (MAU/Ambulatory/Procedural Areas - Information given) No - patient declined information No - patient declined information    Current Medications (verified) Outpatient Encounter Medications as of 05/11/2022  Medication Sig   albuterol (VENTOLIN HFA) 108 (90 Base) MCG/ACT inhaler Inhale 2 puffs into the lungs every 6 (six) hours as needed for wheezing or shortness of breath.   amLODipine (NORVASC) 10 MG tablet Take 1 tablet (10 mg total) by mouth daily.   Cholecalciferol (D3 SUPER STRENGTH) 2000 units CAPS TAKE 2 CAPSULES  (4000  UNITS) EVERY DAY   lisinopril (ZESTRIL) 20 MG tablet Take 1 tablet (20 mg total) by mouth daily.   Omega-3 Fatty Acids (FISH OIL) 1000 MG CAPS Take 1,000 mg by mouth every other day.   OVER THE COUNTER MEDICATION Centrum Silver 50+ Mens   rosuvastatin (CRESTOR) 10 MG tablet Take 1  tablet (10 mg total) by mouth daily. For cholesterol   ferrous sulfate (FEROSUL) 325 (65 FE) MG tablet Take 1 tablet (325 mg total) by mouth daily. (Patient not taking: Reported on 05/11/2022)   No facility-administered encounter medications on file as of 05/11/2022.    Allergies (verified) Patient has no known allergies.   History: Past Medical History:  Diagnosis Date   Adenomatous colon polyp    Anemia    Hyperlipidemia    Hypertension    Kidney stones    Vitamin D deficiency    Past Surgical History:  Procedure Laterality Date   birth mark removed as infant     COLONOSCOPY     COLONOSCOPY W/ POLYPECTOMY     POLYPECTOMY     TONSILLECTOMY     Family History  Problem Relation Age of Onset   Cancer Father        lung   Lung cancer Father    Hyperlipidemia Sister    Hypertension Sister    Hypertension Mother    Thyroid disease Mother    Cancer Maternal Uncle        some type of blood cancer- passed away 1Dec 22, 2020  Colon cancer Neg Hx    Colon polyps Neg Hx    Esophageal cancer Neg Hx    Stomach cancer Neg Hx    Rectal cancer Neg Hx    Social History   Socioeconomic History   Marital  status: Single    Spouse name: Not on file   Number of children: 0   Years of education: 16   Highest education level: Bachelor's degree (e.g., BA, AB, BS)  Occupational History   Occupation: office supply dealer    Comment: retired  Tobacco Use   Smoking status: Never   Smokeless tobacco: Never  Vaping Use   Vaping Use: Never used  Substance and Sexual Activity   Alcohol use: No   Drug use: No   Sexual activity: Yes    Birth control/protection: Condom  Other Topics Concern   Not on file  Social History Narrative   Single   No children   No pets    Social Determinants of Health   Financial Resource Strain: Low Risk  (05/11/2022)   Overall Financial Resource Strain (CARDIA)    Difficulty of Paying Living Expenses: Not hard at all  Food Insecurity: No Food Insecurity  (05/11/2022)   Hunger Vital Sign    Worried About Running Out of Food in the Last Year: Never true    Ran Out of Food in the Last Year: Never true  Transportation Needs: No Transportation Needs (05/11/2022)   PRAPARE - Hydrologist (Medical): No    Lack of Transportation (Non-Medical): No  Physical Activity: Insufficiently Active (05/11/2022)   Exercise Vital Sign    Days of Exercise per Week: 3 days    Minutes of Exercise per Session: 20 min  Stress: No Stress Concern Present (05/11/2022)   [place]    Feeling of Stress : Not at all  Social Connections: Moderately Integrated (05/11/2022)   Social Connection and Isolation Panel [NHANES]    Frequency of Communication with Friends and Family: More than three times a week    Frequency of Social Gatherings with Friends and Family: More than three times a week    Attends Religious Services: More than 4 times per year    Active Member of [employer] or Organizations: Yes    Attends Music therapist: More than 4 times per year    Marital Status: Never married    Tobacco Counseling Counseling given: Not Answered   Clinical Intake:  Pre-visit preparation completed: Yes  Pain : No/denies pain     Nutritional Risks: None Diabetes: No  How often do you need to have someone help you when you read instructions, pamphlets, or other written materials from your doctor or pharmacy?: 1 - Never  Diabetic?no   Interpreter Needed?: No  Information entered by :: Jadene Pierini, LPN   Activities of Daily Living    05/11/2022    2:24 PM 05/07/2022    9:24 AM  In your present state of health, do you have any difficulty performing the following activities:  Hearing? 0 0  Vision? 0 0  Difficulty concentrating or making decisions? 0 0  Walking or climbing stairs? 0 0  Dressing or bathing? 0 0  Doing errands, shopping? 0 0  Preparing Food and  eating ? N N  Using the Toilet? N N  In the past six months, have you accidently leaked urine? N N  Do you have problems with loss of bowel control? N N  Managing your Medications? N N  Managing your Finances? N N  Housekeeping or managing your Housekeeping? N N    Patient Care Team: Claretta Fraise, MD as PCP - General (Family Medicine) Melina Schools, OD (Optometry)  Indicate any recent Medical Services you may have received from other than [facility] providers in the past year (date may be approximate).     Assessment:   This is a routine wellness examination for Jeffrey Downs.  Hearing/Vision screen Vision Screening - Comments:: Wears rx glasses - up to date with routine eye exams with  Dr.johnson   Dietary issues and exercise activities discussed:     Goals Addressed             This Visit's Progress    DIET - INCREASE WATER INTAKE   On track    Try to drink 6-8 glasses of water daily.     Prevent falls   On track      Depression Screen    05/11/2022    2:24 PM 02/17/2022    8:49 AM 08/19/2021    9:17 AM 04/22/2021   11:21 AM 04/03/2021   10:48 AM 02/11/2021    8:16 AM 08/07/2020    9:24 AM  PHQ 2/9 Scores  PHQ - 2 Score 0 0 0 0 0 0 0  PHQ- 9 Score    2       Fall Risk    05/11/2022    2:22 PM 05/07/2022    9:24 AM 02/17/2022    8:49 AM 08/19/2021    9:17 AM 04/22/2021   11:22 AM  Fall Risk   Falls in the past year? 0 0 0 0 0  Number falls in past yr: 0 0     Injury with Fall? 0 0     Risk for fall due to : No Fall Risks      Follow up Falls prevention discussed        FALL RISK PREVENTION PERTAINING TO THE HOME:  Any stairs in or around the home? Yes  If so, are there any without handrails? No  Home free of loose throw rugs in walkways, pet beds, electrical cords, etc? Yes  Adequate lighting in your home to reduce risk of falls? Yes   ASSISTIVE DEVICES UTILIZED TO PREVENT FALLS:  Life alert? No  Use of a cane, walker or w/c? No  Grab bars in the bathroom? No   Shower chair or bench in shower? No  Elevated toilet seat or a handicapped toilet? No       09/11/2016   10:10 AM  MMSE - Mini Mental State Exam  Orientation to time 5  Orientation to Place 5  Registration 3  Attention/ Calculation 5  Recall 3  Language- name 2 objects 2  Language- repeat 1  Language- follow 3 step command 3  Language- read & follow direction 1  Write a sentence 1  Copy design 1  Total score 30        05/11/2022    2:25 PM 04/03/2021   10:48 AM 10/05/2018    2:30 PM  6CIT Screen  What Year? 0 points 0 points 0 points  What month? 0 points 0 points 0 points  What time? 0 points 0 points 0 points  Count back from 20 0 points 0 points 0 points  Months in reverse 0 points 0 points 0 points  Repeat phrase 0 points 0 points 0 points  Total Score 0 points 0 points 0 points    Immunizations Immunization History  Administered Date(s) Administered   Fluad Quad(high Dose 65+) 02/08/2019, 02/08/2020   Influenza, High Dose Seasonal PF 12/30/2015, 12/30/2017   Influenza,inj,Quad PF,6+ Mos 12/26/2014, 12/29/2016   Influenza-Unspecified 01/16/2021  Pneumococcal Conjugate-13 12/26/2014   Pneumococcal Polysaccharide-23 12/30/2015   Tdap 01/29/2011, 02/11/2021   Zoster, Live 07/07/2013    TDAP status: Up to date  Flu Vaccine status: Up to date  Pneumococcal vaccine status: Up to date  Covid-19 vaccine status: Completed vaccines  Qualifies for Shingles Vaccine? Yes   Zostavax completed No   Shingrix Completed?: No.    Education has been provided regarding the importance of this vaccine. Patient has been advised to call insurance company to determine out of pocket expense if they have not yet received this vaccine. Advised may also receive vaccine at local pharmacy or Health Dept. Verbalized acceptance and understanding.  Screening Tests Health Maintenance  Topic Date Due   COVID-19 Vaccine (1) Never done   COLONOSCOPY (Pts 45-46yr Insurance coverage will  need to be confirmed)  04/04/2022   Zoster Vaccines- Shingrix (1 of 2) 05/20/2022 (Originally 09/20/1999)   INFLUENZA VACCINE  06/28/2022 (Originally 10/28/2021)   Medicare Annual Wellness (AWV)  05/12/2023   DTaP/Tdap/Td (3 - Td or Tdap) 02/12/2031   Pneumonia Vaccine 72 Years old  Completed   Hepatitis C Screening  Completed   HPV VACCINES  Aged Out    Health Maintenance  Health Maintenance Due  Topic Date Due   COVID-19 Vaccine (1) Never done   COLONOSCOPY (Pts 45-41yrInsurance coverage will need to be confirmed)  04/04/2022    Colorectal cancer screening: Referral to GI placed Scheduled on 03/31/2022.. Marland Kitchent aware the office will call re: appt.  Lung Cancer Screening: (Low Dose CT Chest recommended if Age 73-80ears, 30 pack-year currently smoking OR have quit w/in 15years.) does not qualify.   Lung Cancer Screening Referral: n/a  Additional Screening:  Hepatitis C Screening: does not qualify; Completed 02/08/2019  Vision Screening: Recommended annual ophthalmology exams for early detection of glaucoma and other disorders of the eye. Is the patient up to date with their annual eye exam?  Yes  Who is the provider or what is the name of the office in which the patient attends annual eye exams? Dr.Johnson  If pt is not established with a provider, would they like to be referred to a provider to establish care? No .   Dental Screening: Recommended annual dental exams for proper oral hygiene  Community Resource Referral / Chronic Care Management: CRR required this visit?  No   CCM required this visit?  No      Plan:     I have personally reviewed and noted the following in the patient's chart:   Medical and social history Use of alcohol, tobacco or illicit drugs  Current medications and supplements including opioid prescriptions. Patient is not currently taking opioid prescriptions. Functional ability and status Nutritional status Physical activity Advanced  directives List of other physicians Hospitalizations, surgeries, and ER visits in previous 12 months Vitals Screenings to include cognitive, depression, and falls Referrals and appointments  In addition, I have reviewed and discussed with patient certain preventive protocols, quality metrics, and best practice recommendations. A written personalized care plan for preventive services as well as general preventive health recommendations were provided to patient.     LaDaphane ShepherdLPN   [id number] Nurse Notes: none

## 2022-05-11 NOTE — Patient Instructions (Signed)
Jeffrey Downs , Thank you for taking time to come for your Medicare Wellness Visit. I appreciate your ongoing commitment to your health goals. Please review the following plan we discussed and let me know if I can assist you in the future.   These are the goals we discussed:  Goals      DIET - INCREASE WATER INTAKE     Try to drink 6-8 glasses of water daily.     Exercise 150 minutes per week (moderate activity)     Prevent falls        This is a list of the screening recommended for you and due dates:  Health Maintenance  Topic Date Due   COVID-19 Vaccine (1) Never done   Colon Cancer Screening  04/04/2022   Zoster (Shingles) Vaccine (1 of 2) 05/20/2022*   Flu Shot  06/28/2022*   Medicare Annual Wellness Visit  05/12/2023   DTaP/Tdap/Td vaccine (3 - Td or Tdap) 02/12/2031   Pneumonia Vaccine  Completed   Hepatitis C Screening: USPSTF Recommendation to screen - Ages 75-79 yo.  Completed   HPV Vaccine  Aged Out  *Topic was postponed. The date shown is not the original due date.    Advanced directives: Advance directive discussed with you today. I have provided a copy for you to complete at home and have notarized. Once this is complete please bring a copy in to our office so we can scan it into your chart.   Conditions/risks identified: Aim for 30 minutes of exercise or brisk walking, 6-8 glasses of water, and 5 servings of fruits and vegetables each day.   Next appointment: Follow up in one year for your annual wellness visit.   Preventive Care 45 Years and Older, Male  Preventive care refers to lifestyle choices and visits with your health care provider that can promote health and wellness. What does preventive care include? A yearly physical exam. This is also called an annual well check. Dental exams once or twice a year. Routine eye exams. Ask your health care provider how often you should have your eyes checked. Personal lifestyle choices, including: Daily care of your  teeth and gums. Regular physical activity. Eating a healthy diet. Avoiding tobacco and drug use. Limiting alcohol use. Practicing safe sex. Taking low doses of aspirin every day. Taking vitamin and mineral supplements as recommended by your health care provider. What happens during an annual well check? The services and screenings done by your health care provider during your annual well check will depend on your age, overall health, lifestyle risk factors, and family history of disease. Counseling  Your health care provider may ask you questions about your: Alcohol use. Tobacco use. Drug use. Emotional well-being. Home and relationship well-being. Sexual activity. Eating habits. History of falls. Memory and ability to understand (cognition). Work and work Statistician. Screening  You may have the following tests or measurements: Height, weight, and BMI. Blood pressure. Lipid and cholesterol levels. These may be checked every 5 years, or more frequently if you are over 26 years old. Skin check. Lung cancer screening. You may have this screening every year starting at age 64 if you have a 30-pack-year history of smoking and currently smoke or have quit within the past 15 years. Fecal occult blood test (FOBT) of the stool. You may have this test every year starting at age 54. Flexible sigmoidoscopy or colonoscopy. You may have a sigmoidoscopy every 5 years or a colonoscopy every 10 years starting at age  50. Prostate cancer screening. Recommendations will vary depending on your family history and other risks. Hepatitis C blood test. Hepatitis B blood test. Sexually transmitted disease (STD) testing. Diabetes screening. This is done by checking your blood sugar (glucose) after you have not eaten for a while (fasting). You may have this done every 1-3 years. Abdominal aortic aneurysm (AAA) screening. You may need this if you are a current or former smoker. Osteoporosis. You may be  screened starting at age 45 if you are at high risk. Talk with your health care provider about your test results, treatment options, and if necessary, the need for more tests. Vaccines  Your health care provider may recommend certain vaccines, such as: Influenza vaccine. This is recommended every year. Tetanus, diphtheria, and acellular pertussis (Tdap, Td) vaccine. You may need a Td booster every 10 years. Zoster vaccine. You may need this after age 60. Pneumococcal 13-valent conjugate (PCV13) vaccine. One dose is recommended after age 62. Pneumococcal polysaccharide (PPSV23) vaccine. One dose is recommended after age 78. Talk to your health care provider about which screenings and vaccines you need and how often you need them. This information is not intended to replace advice given to you by your health care provider. Make sure you discuss any questions you have with your health care provider. Document Released: 04/12/2015 Document Revised: 12/04/2015 Document Reviewed: 01/15/2015 Elsevier Interactive Patient Education  2017 [place] Prevention in the Home Falls can cause injuries. They can happen to people of all ages. There are many things you can do to make your home safe and to help prevent falls. What can I do on the outside of my home? Regularly fix the edges of walkways and driveways and fix any cracks. Remove anything that might make you trip as you walk through a door, such as a raised step or threshold. Trim any bushes or trees on the path to your home. Use bright outdoor lighting. Clear any walking paths of anything that might make someone trip, such as rocks or tools. Regularly check to see if handrails are loose or broken. Make sure that both sides of any steps have handrails. Any raised decks and porches should have guardrails on the edges. Have any leaves, snow, or ice cleared regularly. Use sand or salt on walking paths during winter. Clean up any spills in  your garage right away. This includes oil or grease spills. What can I do in the bathroom? Use night lights. Install grab bars by the toilet and in the tub and shower. Do not use towel bars as grab bars. Use non-skid mats or decals in the tub or shower. If you need to sit down in the shower, use a plastic, non-slip stool. Keep the floor dry. Clean up any water that spills on the floor as soon as it happens. Remove soap buildup in the tub or shower regularly. Attach bath mats securely with double-sided non-slip rug tape. Do not have throw rugs and other things on the floor that can make you trip. What can I do in the bedroom? Use night lights. Make sure that you have a light by your bed that is easy to reach. Do not use any sheets or blankets that are too big for your bed. They should not hang down onto the floor. Have a firm chair that has side arms. You can use this for support while you get dressed. Do not have throw rugs and other things on the floor that can make you  trip. What can I do in the kitchen? Clean up any spills right away. Avoid walking on wet floors. Keep items that you use a lot in easy-to-reach places. If you need to reach something above you, use a strong step stool that has a grab bar. Keep electrical cords out of the way. Do not use floor polish or wax that makes floors slippery. If you must use wax, use non-skid floor wax. Do not have throw rugs and other things on the floor that can make you trip. What can I do with my stairs? Do not leave any items on the stairs. Make sure that there are handrails on both sides of the stairs and use them. Fix handrails that are broken or loose. Make sure that handrails are as long as the stairways. Check any carpeting to make sure that it is firmly attached to the stairs. Fix any carpet that is loose or worn. Avoid having throw rugs at the top or bottom of the stairs. If you do have throw rugs, attach them to the floor with carpet  tape. Make sure that you have a light switch at the top of the stairs and the bottom of the stairs. If you do not have them, ask someone to add them for you. What else can I do to help prevent falls? Wear shoes that: Do not have high heels. Have rubber bottoms. Are comfortable and fit you well. Are closed at the toe. Do not wear sandals. If you use a stepladder: Make sure that it is fully opened. Do not climb a closed stepladder. Make sure that both sides of the stepladder are locked into place. Ask someone to hold it for you, if possible. Clearly mark and make sure that you can see: Any grab bars or handrails. First and last steps. Where the edge of each step is. Use tools that help you move around (mobility aids) if they are needed. These include: Canes. Walkers. Scooters. Crutches. Turn on the lights when you go into a dark area. Replace any light bulbs as soon as they burn out. Set up your furniture so you have a clear path. Avoid moving your furniture around. If any of your floors are uneven, fix them. If there are any pets around you, be aware of where they are. Review your medicines with your doctor. Some medicines can make you feel dizzy. This can increase your chance of falling. Ask your doctor what other things that you can do to help prevent falls. This information is not intended to replace advice given to you by your health care provider. Make sure you discuss any questions you have with your health care provider. Document Released: 01/10/2009 Document Revised: 08/22/2015 Document Reviewed: 04/20/2014 Elsevier Interactive Patient Education  2017 [employer].

## 2022-05-20 ENCOUNTER — Ambulatory Visit (AMBULATORY_SURGERY_CENTER): Payer: No Typology Code available for payment source | Admitting: *Deleted

## 2022-05-20 VITALS — Ht 71.0 in | Wt 188.0 lb

## 2022-05-20 DIAGNOSIS — Z86010 Personal history of colonic polyps: Secondary | ICD-10-CM

## 2022-05-20 MED ORDER — NA SULFATE-K SULFATE-MG SULF 17.5-3.13-1.6 GM/177ML PO SOLN: 1.0000 | Freq: Once | ORAL | 0 refills | Status: AC

## 2022-05-20 NOTE — Progress Notes (Signed)

## 2022-06-16 ENCOUNTER — Encounter: Payer: Self-pay | Admitting: Gastroenterology

## 2022-06-26 DIAGNOSIS — H524 Presbyopia: Secondary | ICD-10-CM | POA: Diagnosis not present

## 2022-06-30 ENCOUNTER — Ambulatory Visit (AMBULATORY_SURGERY_CENTER): Payer: No Typology Code available for payment source | Admitting: Gastroenterology

## 2022-06-30 ENCOUNTER — Encounter: Payer: Self-pay | Admitting: Gastroenterology

## 2022-06-30 VITALS — BP 133/78 | HR 61 | Temp 97.7°F | Resp 13 | Ht 71.0 in | Wt 188.0 lb

## 2022-06-30 DIAGNOSIS — Z09 Encounter for follow-up examination after completed treatment for conditions other than malignant neoplasm: Secondary | ICD-10-CM | POA: Diagnosis not present

## 2022-06-30 DIAGNOSIS — Z860100 Personal history of colon polyps, unspecified: Secondary | ICD-10-CM

## 2022-06-30 DIAGNOSIS — D122 Benign neoplasm of ascending colon: Secondary | ICD-10-CM

## 2022-06-30 DIAGNOSIS — D124 Benign neoplasm of descending colon: Secondary | ICD-10-CM | POA: Diagnosis not present

## 2022-06-30 DIAGNOSIS — D123 Benign neoplasm of transverse colon: Secondary | ICD-10-CM

## 2022-06-30 DIAGNOSIS — E785 Hyperlipidemia, unspecified: Secondary | ICD-10-CM | POA: Diagnosis not present

## 2022-06-30 DIAGNOSIS — Z1211 Encounter for screening for malignant neoplasm of colon: Secondary | ICD-10-CM | POA: Diagnosis not present

## 2022-06-30 DIAGNOSIS — I10 Essential (primary) hypertension: Secondary | ICD-10-CM | POA: Diagnosis not present

## 2022-06-30 MED ORDER — SODIUM CHLORIDE 0.9 % IV SOLN: 500.0000 mL | Freq: Once | INTRAVENOUS | Status: DC

## 2022-06-30 NOTE — Patient Instructions (Signed)
Please read handouts provided. Continue present medications. Await pathology results.   YOU HAD AN ENDOSCOPIC PROCEDURE TODAY AT THE [facility]:   Refer to the procedure report that was given to you for any specific questions about what was found during the examination.  If the procedure report does not answer your questions, please call your gastroenterologist to clarify.  If you requested that your care partner not be given the details of your procedure findings, then the procedure report has been included in a sealed envelope for you to review at your convenience later.  YOU SHOULD EXPECT: Some feelings of bloating in the abdomen. Passage of more gas than usual.  Walking can help get rid of the air that was put into your GI tract during the procedure and reduce the bloating. If you had a lower endoscopy (such as a colonoscopy or flexible sigmoidoscopy) you may notice spotting of blood in your stool or on the toilet paper. If you underwent a bowel prep for your procedure, you may not have a normal bowel movement for a few days.  Please Note:  You might notice some irritation and congestion in your nose or some drainage.  This is from the oxygen used during your procedure.  There is no need for concern and it should clear up in a day or so.  SYMPTOMS TO REPORT IMMEDIATELY:  Following lower endoscopy (colonoscopy or flexible sigmoidoscopy):  Excessive amounts of blood in the stool  Significant tenderness or worsening of abdominal pains  Swelling of the abdomen that is new, acute  Fever of 100F or higher  For urgent or emergent issues, a gastroenterologist can be reached at any hour by calling [phone]. Do not use MyChart messaging for urgent concerns.    DIET:  We do recommend a small meal at first, but then you may proceed to your regular diet.  Drink plenty of fluids but you should avoid alcoholic beverages for 24 hours.  ACTIVITY:  You should plan to take it easy for  the rest of today and you should NOT DRIVE or use heavy machinery until tomorrow (because of the sedation medicines used during the test).    FOLLOW UP: Our staff will call the number listed on your records the next business day following your procedure.  We will call around 7:15- 8:00 am to check on you and address any questions or concerns that you may have regarding the information given to you following your procedure. If we do not reach you, we will leave a message.     If any biopsies were taken you will be contacted by phone or by letter within the next 1-3 weeks.  Please call us at [phone] if you have not heard about the biopsies in 3 weeks.    SIGNATURES/CONFIDENTIALITY: You and/or your care partner have signed paperwork which will be entered into your electronic medical record.  These signatures attest to the fact that that the information above on your After Visit Summary has been reviewed and is understood.  Full responsibility of the confidentiality of this discharge information lies with you and/or your care-partner. 

## 2022-06-30 NOTE — Progress Notes (Signed)
Called to room to assist during endoscopic procedure.  Patient ID and intended procedure confirmed with present staff. Received instructions for my participation in the procedure from the performing physician.  

## 2022-06-30 NOTE — Progress Notes (Signed)
Sedate, gd SR, tolerated procedure well, VSS, report to RN 

## 2022-06-30 NOTE — Progress Notes (Signed)
History & Physical  Primary Care Physician:  Claretta Fraise, MD Primary Gastroenterologist: Lucio Edward, MD  Impression / Plan:  Personal history of adenomatous colon polyps for colonoscopy.   CHIEF COMPLAINT:  Personal history of colon polyps   HPI: Jeffrey Downs is a 72 y.o. male with a personal history of adenomatous colon polyps for colonoscopy.    Past Medical History:  Diagnosis Date   Adenomatous colon polyp    Anemia    Hyperlipidemia    Hypertension    Kidney stones    Vitamin D deficiency     Past Surgical History:  Procedure Laterality Date   birth mark removed as infant     COLONOSCOPY     COLONOSCOPY W/ POLYPECTOMY     POLYPECTOMY     TONSILLECTOMY      Prior to Admission medications   Medication Sig Start Date End Date Taking? Authorizing Provider  amLODipine (NORVASC) 10 MG tablet Take 1 tablet (10 mg total) by mouth daily. 04/22/21  Yes Rakes, Connye Burkitt, FNP  Cholecalciferol (D3 SUPER STRENGTH) 2000 units CAPS TAKE 2 CAPSULES  (4000  UNITS) EVERY DAY Patient taking differently: 5,000 Units. 5,000 iu daily 12/30/17  Yes Stacks, Cletus Gash, MD  lisinopril (ZESTRIL) 20 MG tablet Take 1 tablet (20 mg total) by mouth daily. 02/17/22  Yes Stacks, Cletus Gash, MD  Omega-3 Fatty Acids (FISH OIL) 1000 MG CAPS Take 1,000 mg by mouth every other day.   Yes [provider]  OVER THE COUNTER MEDICATION daily. Centrum Silver 50+ Mens   Yes [provider]  rosuvastatin (CRESTOR) 10 MG tablet Take 1 tablet (10 mg total) by mouth daily. For cholesterol 02/17/22  Yes Stacks, Cletus Gash, MD  albuterol (VENTOLIN HFA) 108 (90 Base) MCG/ACT inhaler Inhale 2 puffs into the lungs every 6 (six) hours as needed for wheezing or shortness of breath. 04/22/21   Rakes, Connye Burkitt, FNP  ferrous sulfate (FEROSUL) 325 (65 FE) MG tablet Take 1 tablet (325 mg total) by mouth daily. Patient not taking: Reported on 05/11/2022 02/17/22   Claretta Fraise, MD    Current Outpatient  Medications  Medication Sig Dispense Refill   amLODipine (NORVASC) 10 MG tablet Take 1 tablet (10 mg total) by mouth daily. 90 tablet 3   Cholecalciferol (D3 SUPER STRENGTH) 2000 units CAPS TAKE 2 CAPSULES  (4000  UNITS) EVERY DAY (Patient taking differently: 5,000 Units. 5,000 iu daily) 180 capsule 1   lisinopril (ZESTRIL) 20 MG tablet Take 1 tablet (20 mg total) by mouth daily. 90 tablet 2   Omega-3 Fatty Acids (FISH OIL) 1000 MG CAPS Take 1,000 mg by mouth every other day.     OVER THE COUNTER MEDICATION daily. Centrum Silver 50+ Mens     rosuvastatin (CRESTOR) 10 MG tablet Take 1 tablet (10 mg total) by mouth daily. For cholesterol 90 tablet 2   albuterol (VENTOLIN HFA) 108 (90 Base) MCG/ACT inhaler Inhale 2 puffs into the lungs every 6 (six) hours as needed for wheezing or shortness of breath. 8 g 2   ferrous sulfate (FEROSUL) 325 (65 FE) MG tablet Take 1 tablet (325 mg total) by mouth daily. (Patient not taking: Reported on 05/11/2022) 90 tablet 3   Current Facility-Administered Medications  Medication Dose Route Frequency Provider Last Rate Last Admin   0.9 %  sodium chloride infusion  500 mL Intravenous Once Ladene Artist, MD        Allergies as of 06/30/2022   (No Known Allergies)  Family History  Problem Relation Age of Onset   Hypertension Mother    Thyroid disease Mother    Cancer Father        lung   Lung cancer Father    Hyperlipidemia Sister    Hypertension Sister    Cancer Maternal Uncle        some type of blood cancer- passed away 04/03/19   Colon cancer Neg Hx    Colon polyps Neg Hx    Esophageal cancer Neg Hx    Stomach cancer Neg Hx    Rectal cancer Neg Hx    Crohn's disease Neg Hx    Ulcerative colitis Neg Hx     Social History   Socioeconomic History   Marital status: Single    Spouse name: Not on file   Number of children: 0   Years of education: 16   Highest education level: Bachelor's degree (e.g., BA, AB, BS)  Occupational History    Occupation: office supply dealer    Comment: retired  Tobacco Use   Smoking status: Never   Smokeless tobacco: Never  Vaping Use   Vaping Use: Never used  Substance and Sexual Activity   Alcohol use: No   Drug use: No   Sexual activity: Yes    Birth control/protection: Condom  Other Topics Concern   Not on file  Social History Narrative   Single   No children   No pets    Social Determinants of Health   Financial Resource Strain: Low Risk  (05/11/2022)   Overall Financial Resource Strain (CARDIA)    Difficulty of Paying Living Expenses: Not hard at all  Food Insecurity: No Food Insecurity (05/11/2022)   Hunger Vital Sign    Worried About Running Out of Food in the Last Year: Never true    Ran Out of Food in the Last Year: Never true  Transportation Needs: No Transportation Needs (05/11/2022)   PRAPARE - Hydrologist (Medical): No    Lack of Transportation (Non-Medical): No  Physical Activity: Insufficiently Active (05/11/2022)   Exercise Vital Sign    Days of Exercise per Week: 3 days    Minutes of Exercise per Session: 20 min  Stress: No Stress Concern Present (05/11/2022)   [name]    Feeling of Stress : Not at all  Social Connections: Moderately Integrated (05/11/2022)   Social Connection and Isolation Panel [NHANES]    Frequency of Communication with Friends and Family: More than three times a week    Frequency of Social Gatherings with Friends and Family: More than three times a week    Attends Religious Services: More than 4 times per year    Active Member of [employer] or Organizations: Yes    Attends Archivist Meetings: More than 4 times per year    Marital Status: Never married  Intimate Partner Violence: Not At Risk (05/11/2022)   Humiliation, Afraid, Rape, and Kick questionnaire    Fear of Current or Ex-Partner: No    Emotionally Abused: No    Physically  Abused: No    Sexually Abused: No    Review of Systems:  All systems reviewed were negative except where noted in HPI.   Physical Exam: General:  Alert, well-developed, in NAD Head:  Normocephalic and atraumatic. Eyes:  Sclera clear, no icterus.   Conjunctiva pink. Ears:  Normal auditory acuity. Mouth:  No deformity or lesions.  Neck:  Supple; no masses. Lungs:  Clear throughout to auscultation.   No wheezes, crackles, or rhonchi.  Heart:  Regular rate and rhythm; no murmurs. Abdomen:  Soft, nondistended, nontender. No masses, hepatomegaly. No palpable masses.  Normal bowel sounds.    Rectal:  Deferred   Msk:  Symmetrical without gross deformities. Extremities:  Without edema. Neurologic:  Alert and  oriented x 4; grossly normal neurologically. Skin:  Intact without significant lesions or rashes. Psych:  Alert and cooperative. Normal mood and affect.   Pricilla Riffle. Fuller Plan  06/30/2022, 9:21 AM See Shea Evans, [facility], to contact our on call provider

## 2022-06-30 NOTE — Op Note (Signed)
[facility] Patient Name: Jeffrey Downs Procedure Date: 06/30/2022 9:19 AM MRN: [id number] Endoscopist: Ladene Artist , MD, [id number] Age: 72 Referring MD:  Date of Birth: [birth year] Gender: Male Account #: [id number] Procedure:                Colonoscopy Indications:              Surveillance: Personal history of adenomatous                            polyps on last colonoscopy 3 years ago Medicines:                Monitored Anesthesia Care Procedure:                Pre-Anesthesia Assessment:                           - Prior to the procedure, a History and Physical                            was performed, and patient medications and                            allergies were reviewed. The patient's tolerance of                            previous anesthesia was also reviewed. The risks                            and benefits of the procedure and the sedation                            options and risks were discussed with the patient.                            All questions were answered, and informed consent                            was obtained. Prior Anticoagulants: The patient has                            taken no anticoagulant or antiplatelet agents. ASA                            Grade Assessment: II - A patient with mild systemic                            disease. After reviewing the risks and benefits,                            the patient was deemed in satisfactory condition to                            undergo the procedure.  After obtaining informed consent, the colonoscope                            was passed under direct vision. Throughout the                            procedure, the patient's blood pressure, pulse, and                            oxygen saturations were monitored continuously. The                            Olympus CF-HQ190L [id number]) Colonoscope was                            introduced through the anus  and advanced to the the                            cecum, identified by appendiceal orifice and                            ileocecal valve. The ileocecal valve, appendiceal                            orifice, and rectum were photographed. The quality                            of the bowel preparation was adequate after                            extensive lavage, suction. The colonoscopy was                            performed without difficulty. The patient tolerated                            the procedure well. Scope In: 9:28:27 AM Scope Out: 9:49:23 AM Scope Withdrawal Time: 0 hours 18 minutes 46 seconds  Total Procedure Duration: 0 hours 20 minutes 56 seconds  Findings:                 The perianal and digital rectal examinations were                            normal.                           Seven sessile polyps were found in the descending                            colon (1), transverse colon (3) and ascending colon                            (3). The polyps were 3 to 10 mm in size. These  polyps were removed with a cold snare. Resection                            and retrieval were complete except for one 6 mm                            transverse colon polyp.                           External hemorrhoids were found during                            retroflexion. The hemorrhoids were moderate.                           The exam was otherwise without abnormality on                            direct and retroflexion views. Complications:            No immediate complications. Estimated blood loss:                            None. Estimated Blood Loss:     Estimated blood loss: none. Impression:               - Seven 3 to 10 mm polyps in the descending colon,                            in the transverse colon and in the ascending colon,                            removed with a cold snare. Resected and retrieved.                           - External  hemorrhoids.                           - The examination was otherwise normal on direct                            and retroflexion views. Recommendation:           - Repeat colonoscopy, likely 3 years, after studies                            are complete for surveillance based on pathology                            results with a more extensive bowel prep.                           - Patient has a contact number available for                            emergencies. The signs  and symptoms of potential                            delayed complications were discussed with the                            patient. Return to normal activities tomorrow.                            Written discharge instructions were provided to the                            patient.                           - Resume previous diet.                           - Continue present medications.                           - Await pathology results. Ladene Artist, MD 06/30/2022 9:53:00 AM This report has been signed electronically.

## 2022-06-30 NOTE — Progress Notes (Signed)
Pt's states no medical or surgical changes since previsit or office visit. 

## 2022-07-01 ENCOUNTER — Telehealth: Payer: Self-pay

## 2022-07-01 NOTE — Telephone Encounter (Signed)
  Follow up Call-     06/30/2022    8:16 AM  Call back number  Post procedure Call Back phone  # [phone]  Permission to leave phone message Yes     Patient questions:  Do you have a fever, pain , or abdominal swelling? No. Pain Score  0 *  Have you tolerated food without any problems? Yes.    Have you been able to return to your normal activities? Yes.    Do you have any questions about your discharge instructions: Diet   No. Medications  No. Follow up visit  No.  Do you have questions or concerns about your Care? No.  Actions: * If pain score is 4 or above: No action needed, pain <4.

## 2022-07-06 ENCOUNTER — Encounter: Payer: Self-pay | Admitting: *Deleted

## 2022-07-09 ENCOUNTER — Encounter: Payer: Self-pay | Admitting: Gastroenterology

## 2022-08-18 ENCOUNTER — Ambulatory Visit: Payer: No Typology Code available for payment source | Admitting: Family Medicine

## 2022-09-01 ENCOUNTER — Ambulatory Visit: Payer: No Typology Code available for payment source | Admitting: Family Medicine

## 2022-09-01 ENCOUNTER — Encounter: Payer: Self-pay | Admitting: Family Medicine

## 2022-09-01 VITALS — BP 109/59 | HR 58 | Temp 97.7°F | Ht 71.0 in | Wt 187.2 lb

## 2022-09-01 DIAGNOSIS — D509 Iron deficiency anemia, unspecified: Secondary | ICD-10-CM

## 2022-09-01 DIAGNOSIS — E782 Mixed hyperlipidemia: Secondary | ICD-10-CM

## 2022-09-01 DIAGNOSIS — Z1159 Encounter for screening for other viral diseases: Secondary | ICD-10-CM | POA: Diagnosis not present

## 2022-09-01 DIAGNOSIS — Z125 Encounter for screening for malignant neoplasm of prostate: Secondary | ICD-10-CM

## 2022-09-01 DIAGNOSIS — I10 Essential (primary) hypertension: Secondary | ICD-10-CM

## 2022-09-01 MED ORDER — LISINOPRIL 20 MG PO TABS: 20.0000 mg | ORAL_TABLET | Freq: Every day | ORAL | 3 refills | Status: DC

## 2022-09-01 MED ORDER — ROSUVASTATIN CALCIUM 10 MG PO TABS: 10.0000 mg | ORAL_TABLET | Freq: Every day | ORAL | 3 refills | Status: DC

## 2022-09-01 MED ORDER — AMLODIPINE BESYLATE 10 MG PO TABS: 10.0000 mg | ORAL_TABLET | Freq: Every day | ORAL | 3 refills | Status: DC

## 2022-09-01 NOTE — Progress Notes (Signed)
Subjective:  Patient ID: Jeffrey Downs, male    DOB: Nov 26, 1950  Age: 72 y.o. MRN: [id number]  CC: Medical Management of Chronic Issues   HPI Jeffrey Downs presents for  follow-up of hypertension. Patient has no history of headache chest pain or shortness of breath or recent cough. Patient also denies symptoms of TIA such as focal numbness or weakness. Patient denies side effects from medication. States taking it regularly.  in for follow-up of elevated cholesterol. Doing well without complaints on current medication. Denies side effects of statin including myalgia and arthralgia and nausea. Currently no chest pain, shortness of breath or other cardiovascular related symptoms noted.  Off iron. Recently gave blood and Hb was 15.2.    History Jeffrey Downs has a past medical history of Adenomatous colon polyp, Anemia, Hyperlipidemia, Hypertension, Kidney stones, and Vitamin D deficiency.   He has a past surgical history that includes Tonsillectomy; Colonoscopy w/ polypectomy; Colonoscopy; Polypectomy; and birth mark removed as infant.   His family history includes Cancer in his father and maternal uncle; Hyperlipidemia in his sister; Hypertension in his mother and sister; Lung cancer in his father; Thyroid disease in his mother.He reports that he has never smoked. He has never used smokeless tobacco. He reports that he does not drink alcohol and does not use drugs.  Current Outpatient Medications on File Prior to Visit  Medication Sig Dispense Refill   albuterol (VENTOLIN HFA) 108 (90 Base) MCG/ACT inhaler Inhale 2 puffs into the lungs every 6 (six) hours as needed for wheezing or shortness of breath. 8 g 2   Cholecalciferol (D3 SUPER STRENGTH) 2000 units CAPS TAKE 2 CAPSULES  (4000  UNITS) EVERY DAY (Patient taking differently: 5,000 Units. 5,000 iu daily) 180 capsule 1   ferrous sulfate (FEROSUL) 325 (65 FE) MG tablet Take 1 tablet (325 mg total) by mouth daily. 90 tablet 3   Omega-3 Fatty  Acids (FISH OIL) 1000 MG CAPS Take 1,000 mg by mouth every other day.     OVER THE COUNTER MEDICATION daily. Centrum Silver 50+ Mens     No current facility-administered medications on file prior to visit.    ROS Review of Systems  Constitutional:  Negative for fever.  Respiratory:  Negative for shortness of breath.   Cardiovascular:  Negative for chest pain.  Musculoskeletal:  Negative for arthralgias.  Skin:  Negative for rash.    Objective:  BP (!) 109/59   Pulse (!) 58   Temp 97.7 F (36.5 C)   Ht 5\' 11"  (1.803 m)   Wt 187 lb 3.2 oz (84.9 kg)   SpO2 95%   BMI 26.11 kg/m   BP Readings from Last 3 Encounters:  09/01/22 (!) 109/59  06/30/22 133/78  02/17/22 132/68    Wt Readings from Last 3 Encounters:  09/01/22 187 lb 3.2 oz (84.9 kg)  06/30/22 188 lb (85.3 kg)  05/20/22 188 lb (85.3 kg)     Physical Exam Vitals reviewed.  Constitutional:      Appearance: He is well-developed.  HENT:     Head: Normocephalic and atraumatic.     Right Ear: External ear normal.     Left Ear: External ear normal.     Mouth/Throat:     Pharynx: No oropharyngeal exudate or posterior oropharyngeal erythema.  Eyes:     Pupils: Pupils are equal, round, and reactive to light.  Cardiovascular:     Rate and Rhythm: Normal rate and regular rhythm.     Heart sounds:  No murmur heard. Pulmonary:     Effort: No respiratory distress.     Breath sounds: Normal breath sounds.  Musculoskeletal:     Cervical back: Normal range of motion and neck supple.  Neurological:     Mental Status: He is alert and oriented to person, place, and time.       Assessment & Plan:   Jeffrey Downs was seen today for medical management of chronic issues.  Diagnoses and all orders for this visit:  Essential hypertension -     CBC with Differential/Platelet -     CMP14+EGFR -     amLODipine (NORVASC) 10 MG tablet; Take 1 tablet (10 mg total) by mouth daily. -     lisinopril (ZESTRIL) 20 MG tablet; Take 1  tablet (20 mg total) by mouth daily.  Mixed hyperlipidemia -     Lipid panel -     rosuvastatin (CRESTOR) 10 MG tablet; Take 1 tablet (10 mg total) by mouth daily. For cholesterol  Iron deficiency anemia, unspecified iron deficiency anemia type -     Iron, TIBC and Ferritin Panel  Prostate cancer screening -     PSA, total and free  Need for hepatitis C screening test -     Hepatitis C antibody   Allergies as of 09/01/2022   No Known Allergies      Medication List        Accurate as of September 01, 2022  8:33 AM. If you have any questions, ask your nurse or doctor.          albuterol 108 (90 Base) MCG/ACT inhaler Commonly known as: VENTOLIN HFA Inhale 2 puffs into the lungs every 6 (six) hours as needed for wheezing or shortness of breath.   amLODipine 10 MG tablet Commonly known as: NORVASC Take 1 tablet (10 mg total) by mouth daily.   Cholecalciferol 50 MCG (2000 UT) Caps Commonly known as: D3 Super Strength TAKE 2 CAPSULES  (4000  UNITS) EVERY DAY What changed:  how much to take additional instructions   ferrous sulfate 325 (65 FE) MG tablet Commonly known as: FeroSul Take 1 tablet (325 mg total) by mouth daily.   Fish Oil 1000 MG Caps Take 1,000 mg by mouth every other day.   lisinopril 20 MG tablet Commonly known as: ZESTRIL Take 1 tablet (20 mg total) by mouth daily.   OVER THE COUNTER MEDICATION daily. Centrum Silver 50+ Mens   rosuvastatin 10 MG tablet Commonly known as: Crestor Take 1 tablet (10 mg total) by mouth daily. For cholesterol        Meds ordered this encounter  Medications   amLODipine (NORVASC) 10 MG tablet    Sig: Take 1 tablet (10 mg total) by mouth daily.    Dispense:  90 tablet    Refill:  3   lisinopril (ZESTRIL) 20 MG tablet    Sig: Take 1 tablet (20 mg total) by mouth daily.    Dispense:  90 tablet    Refill:  3   rosuvastatin (CRESTOR) 10 MG tablet    Sig: Take 1 tablet (10 mg total) by mouth daily. For cholesterol     Dispense:  90 tablet    Refill:  3     Follow-up: Return in about 6 months (around 03/03/2023) for [facility] physical.  Mechele Claude, M.D.

## 2022-09-02 LAB — LIPID PANEL
Chol/HDL Ratio: 2.2 ratio (ref 0.0–5.0)
Cholesterol, Total: 155 mg/dL (ref 100–199)
HDL: 69 mg/dL (ref >39)
LDL Chol Calc (NIH): 72 mg/dL (ref 0–99)
Triglycerides: 74 mg/dL (ref 0–149)
VLDL Cholesterol Cal: 14 mg/dL (ref 5–40)

## 2022-09-02 LAB — CBC WITH DIFFERENTIAL/PLATELET
Basophils Absolute: 0.1 10*3/uL (ref 0.0–0.2)
Basos: 1 %
EOS (ABSOLUTE): 0.3 10*3/uL (ref 0.0–0.4)
Eos: 7 %
Hematocrit: 42 % (ref 37.5–51.0)
Hemoglobin: 14.3 g/dL (ref 13.0–17.7)
Immature Grans (Abs): 0 10*3/uL (ref 0.0–0.1)
Immature Granulocytes: 0 %
Lymphocytes Absolute: 1.3 10*3/uL (ref 0.7–3.1)
Lymphs: 29 %
MCH: 30.6 pg (ref 26.6–33.0)
MCHC: 34 g/dL (ref 31.5–35.7)
MCV: 90 fL (ref 79–97)
Monocytes Absolute: 0.5 10*3/uL (ref 0.1–0.9)
Monocytes: 11 %
Neutrophils Absolute: 2.4 10*3/uL (ref 1.4–7.0)
Neutrophils: 52 %
Platelets: 162 10*3/uL (ref 150–450)
RBC: 4.68 x10E6/uL (ref 4.14–5.80)
RDW: 13.6 % (ref 11.6–15.4)
WBC: 4.6 10*3/uL (ref 3.4–10.8)

## 2022-09-02 LAB — PSA, TOTAL AND FREE
PSA, Free Pct: 42.9 %
PSA, Free: 0.6 ng/mL
Prostate Specific Ag, Serum: 1.4 ng/mL (ref 0.0–4.0)

## 2022-09-02 LAB — IRON,TIBC AND FERRITIN PANEL
Ferritin: 44 ng/mL (ref 30–400)
Iron Saturation: 28 % (ref 15–55)
Iron: 81 ug/dL (ref 38–169)
Total Iron Binding Capacity: 287 ug/dL (ref 250–450)
UIBC: 206 ug/dL (ref 111–343)

## 2022-09-02 LAB — CMP14+EGFR
ALT: 13 IU/L (ref 0–44)
AST: 19 IU/L (ref 0–40)
Albumin/Globulin Ratio: 1.8 (ref 1.2–2.2)
Albumin: 3.9 g/dL (ref 3.8–4.8)
Alkaline Phosphatase: 91 IU/L (ref 44–121)
BUN/Creatinine Ratio: 12 (ref 10–24)
BUN: 14 mg/dL (ref 8–27)
Bilirubin Total: 1 mg/dL (ref 0.0–1.2)
CO2: 24 mmol/L (ref 20–29)
Calcium: 9.3 mg/dL (ref 8.6–10.2)
Chloride: 108 mmol/L — ABNORMAL HIGH (ref 96–106)
Creatinine, Ser: 1.14 mg/dL (ref 0.76–1.27)
Globulin, Total: 2.2 g/dL (ref 1.5–4.5)
Glucose: 85 mg/dL (ref 70–99)
Potassium: 3.9 mmol/L (ref 3.5–5.2)
Sodium: 144 mmol/L (ref 134–144)
Total Protein: 6.1 g/dL (ref 6.0–8.5)
eGFR: 68 mL/min/{1.73_m2} (ref >59)

## 2022-09-02 LAB — HEPATITIS C ANTIBODY: Hep C Virus Ab: NONREACTIVE

## 2022-09-02 NOTE — Progress Notes (Signed)
Hello Elmor,  Your lab result is normal and/or stable.Some minor variations that are not significant are commonly marked abnormal, but do not represent any medical problem for you.  Best regards, Tinaya Ceballos, M.D.

## 2023-03-04 ENCOUNTER — Encounter: Payer: Self-pay | Admitting: Family Medicine

## 2023-03-04 ENCOUNTER — Ambulatory Visit: Payer: No Typology Code available for payment source | Admitting: Family Medicine

## 2023-03-04 VITALS — BP 129/68 | HR 62 | Temp 97.2°F | Ht 69.5 in | Wt 192.0 lb

## 2023-03-04 DIAGNOSIS — Z0001 Encounter for general adult medical examination with abnormal findings: Secondary | ICD-10-CM

## 2023-03-04 DIAGNOSIS — E559 Vitamin D deficiency, unspecified: Secondary | ICD-10-CM

## 2023-03-04 DIAGNOSIS — I10 Essential (primary) hypertension: Secondary | ICD-10-CM

## 2023-03-04 DIAGNOSIS — E782 Mixed hyperlipidemia: Secondary | ICD-10-CM | POA: Diagnosis not present

## 2023-03-04 DIAGNOSIS — Z0000 Encounter for general adult medical examination without abnormal findings: Secondary | ICD-10-CM

## 2023-03-04 DIAGNOSIS — N401 Enlarged prostate with lower urinary tract symptoms: Secondary | ICD-10-CM

## 2023-03-04 DIAGNOSIS — R351 Nocturia: Secondary | ICD-10-CM

## 2023-03-04 LAB — URINALYSIS
Bilirubin, UA: NEGATIVE
Glucose, UA: NEGATIVE
Ketones, UA: NEGATIVE
Nitrite, UA: NEGATIVE
Specific Gravity, UA: 1.02 (ref 1.005–1.030)
Urobilinogen, Ur: 1 mg/dL (ref 0.2–1.0)
pH, UA: 7 (ref 5.0–7.5)

## 2023-03-04 NOTE — Progress Notes (Signed)
Subjective:  Patient ID: Jeffrey Downs, male    DOB: Feb 21, 1951  Age: 72 y.o. MRN: [id number]  CC: Annual Exam   HPI Giorgio Nail Worlds presents for CPE   presents for  follow-up of hypertension. Patient has no history of headache chest pain or shortness of breath or recent cough. Patient also denies symptoms of TIA such as focal numbness or weakness. Patient denies side effects from medication. States taking it regularly.   in for follow-up of elevated cholesterol. Doing well without complaints on current medication. Denies side effects of statin including myalgia and arthralgia and nausea. Currently no chest pain, shortness of breath or other cardiovascular related symptoms noted.  Vitamin D low in past. Due blood test.     03/04/2023    7:56 AM 09/01/2022    8:13 AM 05/11/2022    2:24 PM  Depression screen PHQ 2/9  Decreased Interest 0 0 0  Down, Depressed, Hopeless 0 0 0  PHQ - 2 Score 0 0 0  Altered sleeping 0    Tired, decreased energy 0    Change in appetite 0    Feeling bad or failure about yourself  0    Trouble concentrating 0    Moving slowly or fidgety/restless 0    Suicidal thoughts 0    PHQ-9 Score 0    Difficult doing work/chores Not difficult at all      History Larod has a past medical history of Adenomatous colon polyp, Anemia, Hyperlipidemia, Hypertension, Kidney stones, and Vitamin D deficiency.   He has a past surgical history that includes Tonsillectomy; Colonoscopy w/ polypectomy; Colonoscopy; Polypectomy; and birth mark removed as infant.   His family history includes Cancer in his father and maternal uncle; Hyperlipidemia in his sister; Hypertension in his mother and sister; Lung cancer in his father; Thyroid disease in his mother.He reports that he has never smoked. He has never used smokeless tobacco. He reports that he does not drink alcohol and does not use drugs.    ROS Review of Systems  Objective:  BP 129/68   Pulse 62   Temp (!) 97.2 F  (36.2 C) (Temporal)   Ht 5' 9.5" (1.765 m)   Wt 192 lb (87.1 kg)   SpO2 95%   BMI 27.95 kg/m   BP Readings from Last 3 Encounters:  03/04/23 129/68  09/01/22 (!) 109/59  06/30/22 133/78    Wt Readings from Last 3 Encounters:  03/04/23 192 lb (87.1 kg)  09/01/22 187 lb 3.2 oz (84.9 kg)  06/30/22 188 lb (85.3 kg)     Physical Exam Constitutional:      Appearance: He is well-developed.  HENT:     Head: Normocephalic and atraumatic.  Eyes:     Pupils: Pupils are equal, round, and reactive to light.  Neck:     Thyroid: No thyromegaly.     Trachea: No tracheal deviation.  Cardiovascular:     Rate and Rhythm: Normal rate and regular rhythm.     Heart sounds: Normal heart sounds. No murmur heard.    No friction rub. No gallop.  Pulmonary:     Breath sounds: Normal breath sounds. No wheezing or rales.  Abdominal:     General: Bowel sounds are normal. There is no distension.     Palpations: Abdomen is soft. There is no mass.     Tenderness: There is no abdominal tenderness.     Hernia: There is no hernia in the left inguinal area.  Genitourinary:  Penis: Normal.      Testes: Normal.  Musculoskeletal:        General: Normal range of motion.     Cervical back: Normal range of motion.  Lymphadenopathy:     Cervical: No cervical adenopathy.  Skin:    General: Skin is warm and dry.  Neurological:     Mental Status: He is alert and oriented to person, place, and time.       Assessment & Plan:   There are no diagnoses linked to this encounter.     I am having Dionisio D. Bumgarner maintain his Fish Oil, OVER THE COUNTER MEDICATION, Cholecalciferol, albuterol, ferrous sulfate, amLODipine, lisinopril, and rosuvastatin.  Allergies as of 03/04/2023   No Known Allergies      Medication List        Accurate as of March 04, 2023  8:28 AM. If you have any questions, ask your nurse or doctor.          albuterol 108 (90 Base) MCG/ACT inhaler Commonly known as:  VENTOLIN HFA Inhale 2 puffs into the lungs every 6 (six) hours as needed for wheezing or shortness of breath.   amLODipine 10 MG tablet Commonly known as: NORVASC Take 1 tablet (10 mg total) by mouth daily.   Cholecalciferol 50 MCG (2000 UT) Caps Commonly known as: D3 Super Strength TAKE 2 CAPSULES  (4000  UNITS) EVERY DAY What changed:  how much to take additional instructions   ferrous sulfate 325 (65 FE) MG tablet Commonly known as: FeroSul Take 1 tablet (325 mg total) by mouth daily.   Fish Oil 1000 MG Caps Take 1,000 mg by mouth every other day.   lisinopril 20 MG tablet Commonly known as: ZESTRIL Take 1 tablet (20 mg total) by mouth daily.   OVER THE COUNTER MEDICATION daily. Centrum Silver 50+ Mens   rosuvastatin 10 MG tablet Commonly known as: Crestor Take 1 tablet (10 mg total) by mouth daily. For cholesterol         Follow-up: Return in about 6 months (around 09/02/2023).  Mechele Claude, M.D.

## 2023-03-05 LAB — CMP14+EGFR
ALT: 15 [IU]/L (ref 0–44)
AST: 25 [IU]/L (ref 0–40)
Albumin: 4.1 g/dL (ref 3.8–4.8)
Alkaline Phosphatase: 92 [IU]/L (ref 44–121)
BUN/Creatinine Ratio: 17 (ref 10–24)
BUN: 18 mg/dL (ref 8–27)
Bilirubin Total: 1.1 mg/dL (ref 0.0–1.2)
CO2: 24 mmol/L (ref 20–29)
Calcium: 9.2 mg/dL (ref 8.6–10.2)
Chloride: 109 mmol/L — ABNORMAL HIGH (ref 96–106)
Creatinine, Ser: 1.07 mg/dL (ref 0.76–1.27)
Globulin, Total: 2.6 g/dL (ref 1.5–4.5)
Glucose: 87 mg/dL (ref 70–99)
Potassium: 3.7 mmol/L (ref 3.5–5.2)
Sodium: 147 mmol/L — ABNORMAL HIGH (ref 134–144)
Total Protein: 6.7 g/dL (ref 6.0–8.5)
eGFR: 73 mL/min/{1.73_m2} (ref >59)

## 2023-03-05 LAB — CBC WITH DIFFERENTIAL/PLATELET
Basophils Absolute: 0 10*3/uL (ref 0.0–0.2)
Basos: 1 %
EOS (ABSOLUTE): 0.3 10*3/uL (ref 0.0–0.4)
Eos: 7 %
Hematocrit: 45.4 % (ref 37.5–51.0)
Hemoglobin: 14.5 g/dL (ref 13.0–17.7)
Immature Grans (Abs): 0 10*3/uL (ref 0.0–0.1)
Immature Granulocytes: 0 %
Lymphocytes Absolute: 1.2 10*3/uL (ref 0.7–3.1)
Lymphs: 34 %
MCH: 29 pg (ref 26.6–33.0)
MCHC: 31.9 g/dL (ref 31.5–35.7)
MCV: 91 fL (ref 79–97)
Monocytes Absolute: 0.5 10*3/uL (ref 0.1–0.9)
Monocytes: 13 %
Neutrophils Absolute: 1.6 10*3/uL (ref 1.4–7.0)
Neutrophils: 45 %
Platelets: 158 10*3/uL (ref 150–450)
RBC: 5 x10E6/uL (ref 4.14–5.80)
RDW: 13.8 % (ref 11.6–15.4)
WBC: 3.5 10*3/uL (ref 3.4–10.8)

## 2023-03-05 LAB — LIPID PANEL
Chol/HDL Ratio: 2.5 {ratio} (ref 0.0–5.0)
Cholesterol, Total: 175 mg/dL (ref 100–199)
HDL: 71 mg/dL (ref >39)
LDL Chol Calc (NIH): 91 mg/dL (ref 0–99)
Triglycerides: 71 mg/dL (ref 0–149)
VLDL Cholesterol Cal: 13 mg/dL (ref 5–40)

## 2023-03-05 LAB — PSA, TOTAL AND FREE
PSA, Free Pct: 39.4 %
PSA, Free: 0.63 ng/mL
Prostate Specific Ag, Serum: 1.6 ng/mL (ref 0.0–4.0)

## 2023-03-05 LAB — VITAMIN D 25 HYDROXY (VIT D DEFICIENCY, FRACTURES): Vit D, 25-Hydroxy: 41.2 ng/mL (ref 30.0–100.0)

## 2023-03-07 NOTE — Progress Notes (Signed)
Hello Maddax,  Your lab result is normal and/or stable.Some minor variations that are not significant are commonly marked abnormal, but do not represent any medical problem for you.  Best regards, Myron Stankovich, M.D.

## 2023-05-14 ENCOUNTER — Ambulatory Visit: Payer: No Typology Code available for payment source

## 2023-05-14 VITALS — Ht 69.5 in | Wt 192.0 lb

## 2023-05-14 DIAGNOSIS — Z0000 Encounter for general adult medical examination without abnormal findings: Secondary | ICD-10-CM

## 2023-05-14 NOTE — Patient Instructions (Signed)
Jeffrey Downs , Thank you for taking time to come for your Medicare Wellness Visit. I appreciate your ongoing commitment to your health goals. Please review the following plan we discussed and let me know if I can assist you in the future.   Referrals/Orders/Follow-Ups/Clinician Recommendations: Aim for 30 minutes of exercise or brisk walking, 6-8 glasses of water, and 5 servings of fruits and vegetables each day.  This is a list of the screening recommended for you and due dates:  Health Maintenance  Topic Date Due   COVID-19 Vaccine (1 - 2024-25 season) 03/19/2024*   Medicare Annual Wellness Visit  05/13/2024   Colon Cancer Screening  06/29/2025   DTaP/Tdap/Td vaccine (3 - Td or Tdap) 02/12/2031   Pneumonia Vaccine  Completed   Flu Shot  Completed   Hepatitis C Screening  Completed   Zoster (Shingles) Vaccine  Completed   HPV Vaccine  Aged Out  *Topic was postponed. The date shown is not the original due date.    Advanced directives: (ACP Link)Information on Advanced Care Planning can be found at [facility] [place] Advance Health Care Directives Advance Health Care Directives ([email or web address]   Next Medicare Annual Wellness Visit scheduled for next year: Yes

## 2023-05-14 NOTE — Progress Notes (Signed)
Subjective:   Jeffrey Downs is a 73 y.o. male who presents for Medicare Annual/Subsequent preventive examination.  Visit Complete: Virtual I connected with  Jeffrey Downs on 05/14/23 by a audio enabled telemedicine application and verified that I am speaking with the correct person using two identifiers.  Patient Location: Home  Provider Location: Home Office  This patient declined Interactive audio and video telecommunications. Therefore the visit was completed with audio only.  I discussed the limitations of evaluation and management by telemedicine. The patient expressed understanding and agreed to proceed.  Vital Signs: Because this visit was a virtual/telehealth visit, some criteria may be missing or patient reported. Any vitals not documented were not able to be obtained and vitals that have been documented are patient reported.  Patient Medicare AWV questionnaire was completed by the patient on 05/10/23; I have confirmed that all information answered by patient is correct and no changes since this date.  Cardiac Risk Factors include: advanced age (>79men, >1 women);male gender;hypertension     Objective:    Today's Vitals   05/14/23 1521  Weight: 192 lb (87.1 kg)  Height: 5' 9.5" (1.765 m)   Body mass index is 27.95 kg/m.     05/14/2023    3:23 PM 05/11/2022    2:24 PM 04/03/2021   10:50 AM 10/05/2018    2:28 PM 09/11/2016    9:59 AM 01/09/2014   10:15 AM 12/26/2013    9:58 AM  Advanced Directives  Does Patient Have a Medical Advance Directive? No No No No No No No  Would patient like information on creating a medical advance directive? Yes (MAU/Ambulatory/Procedural Areas - Information given) No - Patient declined No - Patient declined No - Patient declined Yes (MAU/Ambulatory/Procedural Areas - Information given) No - patient declined information No - patient declined information    Current Medications (verified) Outpatient Encounter Medications as of 05/14/2023   Medication Sig   albuterol (VENTOLIN HFA) 108 (90 Base) MCG/ACT inhaler Inhale 2 puffs into the lungs every 6 (six) hours as needed for wheezing or shortness of breath.   amLODipine (NORVASC) 10 MG tablet Take 1 tablet (10 mg total) by mouth daily.   Cholecalciferol (D3 SUPER STRENGTH) 2000 units CAPS TAKE 2 CAPSULES  (4000  UNITS) EVERY DAY (Patient taking differently: 5,000 Units. 5,000 iu daily)   ferrous sulfate (FEROSUL) 325 (65 FE) MG tablet Take 1 tablet (325 mg total) by mouth daily.   lisinopril (ZESTRIL) 20 MG tablet Take 1 tablet (20 mg total) by mouth daily.   Omega-3 Fatty Acids (FISH OIL) 1000 MG CAPS Take 1,000 mg by mouth every other day.   OVER THE COUNTER MEDICATION daily. Centrum Silver 50+ Mens   rosuvastatin (CRESTOR) 10 MG tablet Take 1 tablet (10 mg total) by mouth daily. For cholesterol   No facility-administered encounter medications on file as of 05/14/2023.    Allergies (verified) Patient has no known allergies.   History: Past Medical History:  Diagnosis Date   Adenomatous colon polyp    Anemia    Hyperlipidemia    Hypertension    Kidney stones    Vitamin D deficiency    Past Surgical History:  Procedure Laterality Date   birth mark removed as infant     COLONOSCOPY     COLONOSCOPY W/ POLYPECTOMY     POLYPECTOMY     TONSILLECTOMY     Family History  Problem Relation Age of Onset   Hypertension Mother    Thyroid disease  Mother    Cancer Father        lung   Lung cancer Father    Hyperlipidemia Sister    Hypertension Sister    Cancer Maternal Uncle        some type of blood cancer- passed away 03-24-19   Colon cancer Neg Hx    Colon polyps Neg Hx    Esophageal cancer Neg Hx    Stomach cancer Neg Hx    Rectal cancer Neg Hx    Crohn's disease Neg Hx    Ulcerative colitis Neg Hx    Social History   Socioeconomic History   Marital status: Single    Spouse name: Not on file   Number of children: 0   Years of education: 16   Highest  education level: Bachelor's degree (e.g., BA, AB, BS)  Occupational History   Occupation: office supply dealer    Comment: retired  Tobacco Use   Smoking status: Never   Smokeless tobacco: Never  Vaping Use   Vaping status: Never Used  Substance and Sexual Activity   Alcohol use: No   Drug use: No   Sexual activity: Not Currently    Birth control/protection: Condom  Other Topics Concern   Not on file  Social History Narrative   Single   No children   No pets    Social Drivers of Corporate investment banker Strain: Low Risk  (05/14/2023)   Overall Financial Resource Strain (CARDIA)    Difficulty of Paying Living Expenses: Not hard at all  Food Insecurity: No Food Insecurity (05/14/2023)   Hunger Vital Sign    Worried About Running Out of Food in the Last Year: Never true    Ran Out of Food in the Last Year: Never true  Transportation Needs: No Transportation Needs (05/14/2023)   PRAPARE - Administrator, Civil Service (Medical): No    Lack of Transportation (Non-Medical): No  Physical Activity: Insufficiently Active (05/14/2023)   Exercise Vital Sign    Days of Exercise per Week: 3 days    Minutes of Exercise per Session: 30 min  Stress: No Stress Concern Present (05/14/2023)   [employer] of Occupational Health - Occupational Stress Questionnaire    Feeling of Stress : Not at all  Social Connections: Moderately Integrated (05/14/2023)   Social Connection and Isolation Panel [NHANES]    Frequency of Communication with Friends and Family: More than three times a week    Frequency of Social Gatherings with Friends and Family: Three times a week    Attends Religious Services: More than 4 times per year    Active Member of Clubs or Organizations: Yes    Attends Engineer, structural: More than 4 times per year    Marital Status: Never married    Tobacco Counseling Counseling given: Not Answered   Clinical Intake:  Pre-visit preparation completed:  Yes  Pain : No/denies pain     Diabetes: No  How often do you need to have someone help you when you read instructions, pamphlets, or other written materials from your doctor or pharmacy?: 1 - Never  Interpreter Needed?: No  Information entered by :: Kandis Fantasia LPN   Activities of Daily Living    05/10/2023    1:27 PM  In your present state of health, do you have any difficulty performing the following activities:  Hearing? 0  Vision? 0  Difficulty concentrating or making decisions? 0  Walking or climbing stairs?  0  Dressing or bathing? 0  Doing errands, shopping? 0  Preparing Food and eating ? N  Using the Toilet? N  In the past six months, have you accidently leaked urine? N  Do you have problems with loss of bowel control? N  Managing your Medications? N  Managing your Finances? N  Housekeeping or managing your Housekeeping? N    Patient Care Team: Mechele Claude, MD as PCP - General (Family Medicine) Derryl Harbor, OD (Optometry)  Indicate any recent Medical Services you may have received from other than [facility] providers in the past year (date may be approximate).     Assessment:   This is a routine wellness examination for Cola.  Hearing/Vision screen Hearing Screening - Comments:: Denies hearing difficulties   Vision Screening - Comments:: Wears rx glasses - up to date with routine eye exams with [facility] [place]     Goals Addressed   None   Depression Screen    05/14/2023    3:22 PM 03/04/2023    7:56 AM 09/01/2022    8:13 AM 05/11/2022    2:24 PM 02/17/2022    8:49 AM 08/19/2021    9:17 AM 04/22/2021   11:21 AM  PHQ 2/9 Scores  PHQ - 2 Score 0 0 0 0 0 0 0  PHQ- 9 Score  0     2    Fall Risk    05/10/2023    1:27 PM 03/04/2023    7:56 AM 09/01/2022    8:13 AM 05/11/2022    2:22 PM 05/07/2022    9:24 AM  Fall Risk   Falls in the past year? 0 0 0 0 0  Number falls in past yr: 0   0 0  Injury with Fall? 0   0 0  Risk for fall due to : No Fall  Risks   No Fall Risks   Follow up Falls prevention discussed;Education provided;Falls evaluation completed   Falls prevention discussed     MEDICARE RISK AT HOME: Medicare Risk at Home Any stairs in or around the home?: (Patient-Rptd) Yes If so, are there any without handrails?: (Patient-Rptd) No Home free of loose throw rugs in walkways, pet beds, electrical cords, etc?: (Patient-Rptd) No Adequate lighting in your home to reduce risk of falls?: (Patient-Rptd) Yes Life alert?: (Patient-Rptd) No Use of a cane, walker or w/c?: (Patient-Rptd) No Grab bars in the bathroom?: (Patient-Rptd) No Shower chair or bench in shower?: (Patient-Rptd) No Elevated toilet seat or a handicapped toilet?: (Patient-Rptd) Yes  TIMED UP AND GO:  Was the test performed?  No    Cognitive Function:    09/11/2016   10:10 AM  MMSE - Mini Mental State Exam  Orientation to time 5  Orientation to Place 5  Registration 3  Attention/ Calculation 5  Recall 3  Language- name 2 objects 2  Language- repeat 1  Language- follow 3 step command 3  Language- read & follow direction 1  Write a sentence 1  Copy design 1  Total score 30        05/14/2023    3:24 PM 05/11/2022    2:25 PM 04/03/2021   10:48 AM 10/05/2018    2:30 PM  6CIT Screen  What Year? 0 points 0 points 0 points 0 points  What month? 0 points 0 points 0 points 0 points  What time? 0 points 0 points 0 points 0 points  Count back from 20 0 points 0 points 0 points 0  points  Months in reverse 0 points 0 points 0 points 0 points  Repeat phrase 0 points 0 points 0 points 0 points  Total Score 0 points 0 points 0 points 0 points    Immunizations Immunization History  Administered Date(s) Administered   Fluad Quad(high Dose 65+) 02/08/2019, 02/08/2020   Influenza, High Dose Seasonal PF 12/30/2015, 12/30/2017, 12/29/2022   Influenza,inj,Quad PF,6+ Mos 12/26/2014, 12/29/2016   Influenza-Unspecified 01/16/2021   Pneumococcal Conjugate-13 12/26/2014    Pneumococcal Polysaccharide-23 12/30/2015   Tdap 01/29/2011, 02/11/2021   Zoster Recombinant(Shingrix) 10/14/2021, 01/02/2022   Zoster, Live 07/07/2013    TDAP status: Up to date  Flu Vaccine status: Up to date  Pneumococcal vaccine status: Up to date  Covid-19 vaccine status: Information provided on how to obtain vaccines.   Qualifies for Shingles Vaccine? Yes   Zostavax completed Yes   Shingrix Completed?: Yes  Screening Tests Health Maintenance  Topic Date Due   COVID-19 Vaccine (1 - 2024-25 season) 03/19/2024 (Originally 11/29/2022)   Medicare Annual Wellness (AWV)  05/13/2024   Colonoscopy  06/29/2025   DTaP/Tdap/Td (3 - Td or Tdap) 02/12/2031   Pneumonia Vaccine 26+ Years old  Completed   INFLUENZA VACCINE  Completed   Hepatitis C Screening  Completed   Zoster Vaccines- Shingrix  Completed   HPV VACCINES  Aged Out    Health Maintenance  There are no preventive care reminders to display for this patient.   Colorectal cancer screening: Type of screening: Colonoscopy. Completed 06/30/22. Repeat every 3 years  Lung Cancer Screening: (Low Dose CT Chest recommended if Age 99-80 years, 20 pack-year currently smoking OR have quit w/in 15years.) does not qualify.   Lung Cancer Screening Referral: n/a  Additional Screening:  Hepatitis C Screening: does qualify; Completed 09/01/22  Vision Screening: Recommended annual ophthalmology exams for early detection of glaucoma and other disorders of the eye. Is the patient up to date with their annual eye exam?  Yes  Who is the provider or what is the name of the office in which the patient attends annual eye exams? [facility]  If pt is not established with a provider, would they like to be referred to a provider to establish care? No .   Dental Screening: Recommended annual dental exams for proper oral hygiene  Community Resource Referral / Chronic Care Management: CRR required this visit?  No   CCM required this  visit?  No     Plan:     I have personally reviewed and noted the following in the patient's chart:   Medical and social history Use of alcohol, tobacco or illicit drugs  Current medications and supplements including opioid prescriptions. Patient is not currently taking opioid prescriptions. Functional ability and status Nutritional status Physical activity Advanced directives List of other physicians Hospitalizations, surgeries, and ER visits in previous 12 months Vitals Screenings to include cognitive, depression, and falls Referrals and appointments  In addition, I have reviewed and discussed with patient certain preventive protocols, quality metrics, and best practice recommendations. A written personalized care plan for preventive services as well as general preventive health recommendations were provided to patient.     Kandis Fantasia [address], [place]   [id number]   After Visit Summary: (MyChart) Due to this being a telephonic visit, the after visit summary with patients personalized plan was offered to patient via MyChart   Nurse Notes: No concerns at this time

## 2023-07-12 ENCOUNTER — Other Ambulatory Visit: Payer: Self-pay | Admitting: Family Medicine

## 2023-07-12 DIAGNOSIS — I10 Essential (primary) hypertension: Secondary | ICD-10-CM

## 2023-07-12 MED ORDER — LISINOPRIL 20 MG PO TABS
20.0000 mg | ORAL_TABLET | Freq: Every day | ORAL | 1 refills | Status: DC
Start: 2023-07-12 — End: 2023-09-02

## 2023-07-12 NOTE — Telephone Encounter (Signed)
Complete  -LS

## 2023-07-12 NOTE — Telephone Encounter (Signed)
 Copied from CRM [id number]. Topic: Clinical - Medication Refill >> Jul 12, 2023  2:48 PM Zipporah Him wrote: Most Recent Primary Care Visit:   Medication: lisinopril (ZESTRIL) 20 MG tablet (100 day supply requested, per [facility])  Has the patient contacted their pharmacy? No (Agent: If no, request that the patient contact the pharmacy for the refill. If patient does not wish to contact the pharmacy document the reason why and proceed with request.) (Agent: If yes, when and what did the pharmacy advise?)  Is this the correct pharmacy for this prescription? Yes If no, delete pharmacy and type the correct one.  This is the patient's preferred pharmacy:   [facility] - [place], [place] - [address] [facility] AT Portal to Registered Caremark Sites [address] [place] [address] Phone: [phone] Fax: [phone]   Has the prescription been filled recently? No  Is the patient out of the medication? Yes  Has the patient been seen for an appointment in the last year OR does the patient have an upcoming appointment? Yes  Can we respond through MyChart? Yes  Agent: Please be advised that Rx refills may take up to 3 business days. We ask that you follow-up with your pharmacy.

## 2023-08-11 DIAGNOSIS — H353131 Nonexudative age-related macular degeneration, bilateral, early dry stage: Secondary | ICD-10-CM | POA: Diagnosis not present

## 2023-08-11 DIAGNOSIS — H43813 Vitreous degeneration, bilateral: Secondary | ICD-10-CM | POA: Diagnosis not present

## 2023-08-11 DIAGNOSIS — H2513 Age-related nuclear cataract, bilateral: Secondary | ICD-10-CM | POA: Diagnosis not present

## 2023-09-02 ENCOUNTER — Encounter: Payer: Self-pay | Admitting: Family Medicine

## 2023-09-02 ENCOUNTER — Telehealth: Payer: Self-pay | Admitting: Pharmacist

## 2023-09-02 ENCOUNTER — Ambulatory Visit (INDEPENDENT_AMBULATORY_CARE_PROVIDER_SITE_OTHER): Payer: No Typology Code available for payment source | Admitting: Family Medicine

## 2023-09-02 DIAGNOSIS — E782 Mixed hyperlipidemia: Secondary | ICD-10-CM

## 2023-09-02 DIAGNOSIS — I10 Essential (primary) hypertension: Secondary | ICD-10-CM | POA: Diagnosis not present

## 2023-09-02 LAB — LIPID PANEL

## 2023-09-02 MED ORDER — ROSUVASTATIN CALCIUM 10 MG PO TABS: 10.0000 mg | ORAL_TABLET | Freq: Every day | ORAL | 3 refills | Status: AC

## 2023-09-02 MED ORDER — AMLODIPINE BESYLATE 10 MG PO TABS: 10.0000 mg | ORAL_TABLET | Freq: Every day | ORAL | 3 refills | Status: DC

## 2023-09-02 MED ORDER — AMLODIPINE BESYLATE 10 MG PO TABS: 10.0000 mg | ORAL_TABLET | Freq: Every day | ORAL | 3 refills | Status: AC

## 2023-09-02 MED ORDER — ROSUVASTATIN CALCIUM 10 MG PO TABS: 10.0000 mg | ORAL_TABLET | Freq: Every day | ORAL | 3 refills | Status: DC

## 2023-09-02 MED ORDER — LISINOPRIL 20 MG PO TABS: 20.0000 mg | ORAL_TABLET | Freq: Every day | ORAL | 1 refills | Status: DC

## 2023-09-02 NOTE — Progress Notes (Signed)
 Subjective:  Patient ID: Jeffrey Downs, male    DOB: [birth year]  Age: 73 y.o. MRN: [id number]  CC: Medical Management of Chronic Issues   HPI Jeffrey Downs presents for  follow-up of hypertension. Patient has no history of headache chest pain or shortness of breath or recent cough. Patient also denies symptoms of TIA such as focal numbness or weakness. Patient denies side effects from medication. States taking it regularly.   in for follow-up of elevated cholesterol. Doing well without complaints on current medication. Denies side effects of statin including myalgia and arthralgia and nausea. Currently no chest pain, shortness of breath or other cardiovascular related symptoms noted.    History Jeffrey Downs has a past medical history of Adenomatous colon polyp, Anemia, Hyperlipidemia, Hypertension, Kidney stones, and Vitamin D  deficiency.   He has a past surgical history that includes Tonsillectomy; Colonoscopy w/ polypectomy; Colonoscopy; Polypectomy; and birth mark removed as infant.   His family history includes Cancer in his father and maternal uncle; Hyperlipidemia in his sister; Hypertension in his mother and sister; Lung cancer in his father; Thyroid disease in his mother.He reports that he has never smoked. He has never used smokeless tobacco. He reports that he does not drink alcohol and does not use drugs.  Current Outpatient Medications on File Prior to Visit  Medication Sig Dispense Refill   albuterol  (VENTOLIN  HFA) 108 (90 Base) MCG/ACT inhaler Inhale 2 puffs into the lungs every 6 (six) hours as needed for wheezing or shortness of breath. 8 g 2   Cholecalciferol  (D3 SUPER STRENGTH) 2000 units CAPS TAKE 2 CAPSULES  (4000  UNITS) EVERY DAY (Patient taking differently: 5,000 Units. 5,000 iu daily) 180 capsule 1   lisinopril  (ZESTRIL ) 20 MG tablet Take 1 tablet (20 mg total) by mouth daily. 90 tablet 1   Omega-3 Fatty Acids (FISH OIL) 1000 MG CAPS Take 1,000 mg by mouth every other  day.     OVER THE COUNTER MEDICATION daily. Centrum Silver 50+ Mens     ferrous sulfate  (FEROSUL) 325 (65 FE) MG tablet Take 1 tablet (325 mg total) by mouth daily. (Patient not taking: Reported on 09/02/2023) 90 tablet 3   No current facility-administered medications on file prior to visit.    ROS Review of Systems  Constitutional:  Negative for fever.  Respiratory:  Negative for shortness of breath.   Cardiovascular:  Negative for chest pain.  Musculoskeletal:  Negative for arthralgias.  Skin:  Negative for rash.    Objective:  BP 114/64   Pulse 83   Temp 98.1 F (36.7 C)   Ht 5' 9.5" (1.765 m)   Wt 192 lb (87.1 kg)   SpO2 94%   BMI 27.95 kg/m   BP Readings from Last 3 Encounters:  09/02/23 114/64  03/04/23 129/68  09/01/22 (!) 109/59    Wt Readings from Last 3 Encounters:  09/02/23 192 lb (87.1 kg)  05/14/23 192 lb (87.1 kg)  03/04/23 192 lb (87.1 kg)     Physical Exam Vitals reviewed.  Constitutional:      Appearance: He is well-developed.  HENT:     Head: Normocephalic and atraumatic.     Right Ear: External ear normal.     Left Ear: External ear normal.     Mouth/Throat:     Pharynx: No oropharyngeal exudate or posterior oropharyngeal erythema.  Eyes:     Pupils: Pupils are equal, round, and reactive to light.  Cardiovascular:     Rate and Rhythm: Normal  rate and regular rhythm.     Heart sounds: No murmur heard. Pulmonary:     Effort: No respiratory distress.     Breath sounds: Normal breath sounds.  Musculoskeletal:     Cervical back: Normal range of motion and neck supple.  Neurological:     Mental Status: He is alert and oriented to person, place, and time.       Assessment & Plan:  Mixed hyperlipidemia -     CBC with Differential/Platelet -     CMP14+EGFR -     Lipid panel -     Rosuvastatin  Calcium ; Take 1 tablet (10 mg total) by mouth daily. For cholesterol  Dispense: 90 tablet; Refill: 3  Essential hypertension -     CBC with  Differential/Platelet -     CMP14+EGFR -     Lipid panel -     amLODIPine  Besylate; Take 1 tablet (10 mg total) by mouth daily.  Dispense: 90 tablet; Refill: 3    Allergies as of 09/02/2023   No Known Allergies      Medication List        Accurate as of September 02, 2023  8:15 AM. If you have any questions, ask your nurse or doctor.          albuterol  108 (90 Base) MCG/ACT inhaler Commonly known as: VENTOLIN  HFA Inhale 2 puffs into the lungs every 6 (six) hours as needed for wheezing or shortness of breath.   amLODipine  10 MG tablet Commonly known as: NORVASC  Take 1 tablet (10 mg total) by mouth daily.   Cholecalciferol  50 MCG (2000 UT) Caps Commonly known as: D3 Super Strength TAKE 2 CAPSULES  (4000  UNITS) EVERY DAY What changed:  how much to take additional instructions   ferrous sulfate  325 (65 FE) MG tablet Commonly known as: FeroSul Take 1 tablet (325 mg total) by mouth daily.   Fish Oil 1000 MG Caps Take 1,000 mg by mouth every other day.   lisinopril  20 MG tablet Commonly known as: ZESTRIL  Take 1 tablet (20 mg total) by mouth daily.   OVER THE COUNTER MEDICATION daily. Centrum Silver 50+ Mens   rosuvastatin  10 MG tablet Commonly known as: Crestor  Take 1 tablet (10 mg total) by mouth daily. For cholesterol         Follow-up: No follow-ups on file.  Roise Cleaver, M.D.

## 2023-09-02 NOTE — Telephone Encounter (Signed)
   This patient is appearing on a report for being at risk of failing the adherence measure for hypertension (ACEi/ARB) medications this calendar year.   Medication: lisinopril  Last fill date: 04/06/23 for 90 day supply  Contacted pharmacy to facilitate refills. and Will collaborate with provider to facilitate refill needs.   Cionna Collantes Dattero Alizaya Oshea, PharmD, BCACP, CPP Clinical Pharmacist, [facility]

## 2023-09-03 ENCOUNTER — Other Ambulatory Visit: Payer: Self-pay | Admitting: Family Medicine

## 2023-09-03 DIAGNOSIS — R062 Wheezing: Secondary | ICD-10-CM

## 2023-09-03 DIAGNOSIS — J069 Acute upper respiratory infection, unspecified: Secondary | ICD-10-CM

## 2023-09-06 ENCOUNTER — Ambulatory Visit: Payer: Self-pay | Admitting: Family Medicine

## 2023-09-06 LAB — CMP14+EGFR
ALT: 17 IU/L (ref 0–44)
AST: 20 IU/L (ref 0–40)
Albumin: 4.3 g/dL (ref 3.8–4.8)
Alkaline Phosphatase: 93 IU/L (ref 44–121)
BUN/Creatinine Ratio: 15 (ref 10–24)
BUN: 17 mg/dL (ref 8–27)
Bilirubin Total: 0.6 mg/dL (ref 0.0–1.2)
CO2: 21 mmol/L (ref 20–29)
Calcium: 9.1 mg/dL (ref 8.6–10.2)
Chloride: 109 mmol/L — ABNORMAL HIGH (ref 96–106)
Creatinine, Ser: 1.14 mg/dL (ref 0.76–1.27)
Globulin, Total: 2.1 g/dL (ref 1.5–4.5)
Glucose: 90 mg/dL (ref 70–99)
Potassium: 4.1 mmol/L (ref 3.5–5.2)
Sodium: 144 mmol/L (ref 134–144)
Total Protein: 6.4 g/dL (ref 6.0–8.5)
eGFR: 68 mL/min/{1.73_m2} (ref >59)

## 2023-09-06 LAB — LIPID PANEL
Chol/HDL Ratio: 2.4 ratio (ref 0.0–5.0)
Cholesterol, Total: 173 mg/dL (ref 100–199)
HDL: 73 mg/dL (ref >39)
LDL Chol Calc (NIH): 86 mg/dL (ref 0–99)
Triglycerides: 74 mg/dL (ref 0–149)
VLDL Cholesterol Cal: 14 mg/dL (ref 5–40)

## 2023-09-06 LAB — CBC WITH DIFFERENTIAL/PLATELET
Basophils Absolute: 0.1 10*3/uL (ref 0.0–0.2)
Basos: 1 %
EOS (ABSOLUTE): 0.4 10*3/uL (ref 0.0–0.4)
Eos: 11 %
Hematocrit: 42.5 % (ref 37.5–51.0)
Hemoglobin: 13.8 g/dL (ref 13.0–17.7)
Immature Grans (Abs): 0 10*3/uL (ref 0.0–0.1)
Immature Granulocytes: 0 %
Lymphocytes Absolute: 1.3 10*3/uL (ref 0.7–3.1)
Lymphs: 34 %
MCH: 29.6 pg (ref 26.6–33.0)
MCHC: 32.5 g/dL (ref 31.5–35.7)
MCV: 91 fL (ref 79–97)
Monocytes Absolute: 0.5 10*3/uL (ref 0.1–0.9)
Monocytes: 13 %
Neutrophils Absolute: 1.5 10*3/uL (ref 1.4–7.0)
Neutrophils: 41 %
Platelets: 188 10*3/uL (ref 150–450)
RBC: 4.66 x10E6/uL (ref 4.14–5.80)
RDW: 14.3 % (ref 11.6–15.4)
WBC: 3.7 10*3/uL (ref 3.4–10.8)

## 2023-09-06 NOTE — Progress Notes (Signed)
Hello Maddax,  Your lab result is normal and/or stable.Some minor variations that are not significant are commonly marked abnormal, but do not represent any medical problem for you.  Best regards, Myron Stankovich, M.D.

## 2023-12-07 DIAGNOSIS — Z008 Encounter for other general examination: Secondary | ICD-10-CM | POA: Diagnosis not present

## 2023-12-07 DIAGNOSIS — E785 Hyperlipidemia, unspecified: Secondary | ICD-10-CM | POA: Diagnosis not present

## 2023-12-07 DIAGNOSIS — I129 Hypertensive chronic kidney disease with stage 1 through stage 4 chronic kidney disease, or unspecified chronic kidney disease: Secondary | ICD-10-CM | POA: Diagnosis not present

## 2024-03-02 ENCOUNTER — Encounter: Payer: Self-pay | Admitting: Family Medicine

## 2024-03-02 ENCOUNTER — Ambulatory Visit: Payer: Self-pay | Admitting: Family Medicine

## 2024-03-02 VITALS — BP 132/67 | HR 66 | Temp 97.9°F | Ht 69.5 in | Wt 187.0 lb

## 2024-03-02 DIAGNOSIS — R052 Subacute cough: Secondary | ICD-10-CM

## 2024-03-02 DIAGNOSIS — E559 Vitamin D deficiency, unspecified: Secondary | ICD-10-CM

## 2024-03-02 DIAGNOSIS — E782 Mixed hyperlipidemia: Secondary | ICD-10-CM

## 2024-03-02 DIAGNOSIS — I10 Essential (primary) hypertension: Secondary | ICD-10-CM

## 2024-03-02 MED ORDER — AZITHROMYCIN 250 MG PO TABS: ORAL_TABLET | ORAL | 0 refills | Status: AC

## 2024-03-02 MED ORDER — LISINOPRIL 20 MG PO TABS: 20.0000 mg | ORAL_TABLET | Freq: Every day | ORAL | 1 refills | Status: AC

## 2024-03-02 MED ORDER — PREDNISONE 10 MG PO TABS: ORAL_TABLET | ORAL | 0 refills | Status: AC

## 2024-03-02 NOTE — Progress Notes (Signed)
 HPI  Discussed the use of AI scribe software for clinical note transcription with the patient, who gave verbal consent to proceed.  History of Present Illness Jeffrey Downs is a 73 year old male with hypertension who presents with a persistent dry cough.  He has been experiencing a persistent dry cough, described as being caused by a 'tickle' in his throat, without sputum production. The cough has been ongoing and is bothersome enough to consider medication.  He experiences shortness of breath, which has been present for some time. He uses his inhaler every four to six hours, although in November he hardly used it at all. He notes a decrease in his respiratory capacity, but it is not limiting his activities.  He has a history of hypertension and is currently taking lisinopril , which he has been on for a long time. He did not take his blood pressure medication today as he does not usually take it early in the morning. He mentions that his blood pressure is not usually high.  He takes ferrous sulfate  over the counter about once a month. He recalls a past incident where he was told he had too much iron  in his blood, leading to a reduction in his iron  intake.  He reports experiencing some sinus drainage, particularly in October. No sputum production with his cough.          03/02/2024    7:55 AM 05/14/2023    3:22 PM 03/04/2023    7:56 AM  Depression screen PHQ 2/9  Decreased Interest 0 0 0  Down, Depressed, Hopeless 0 0 0  PHQ - 2 Score 0 0 0  Altered sleeping 0  0  Tired, decreased energy 0  0  Change in appetite 0  0  Feeling bad or failure about yourself  0  0  Trouble concentrating 0  0  Moving slowly or fidgety/restless 0  0  Suicidal thoughts 0  0  PHQ-9 Score 0  0   Difficult doing work/chores Not difficult at all  Not difficult at all     Data saved with a previous flowsheet row definition    History Jeffrey Downs has a past medical history of Adenomatous colon polyp,  Anemia, Hyperlipidemia, Hypertension, Kidney stones, and Vitamin D  deficiency.   He has a past surgical history that includes Tonsillectomy; Colonoscopy w/ polypectomy; Colonoscopy; Polypectomy; and birth mark removed as infant.   His family history includes Cancer in his father and maternal uncle; Hyperlipidemia in his sister; Hypertension in his mother and sister; Lung cancer in his father; Thyroid disease in his mother.He reports that he has never smoked. He has never used smokeless tobacco. He reports that he does not drink alcohol and does not use drugs.    ROS Review of Systems  Constitutional: Negative.   HENT: Negative.    Eyes:  Negative for visual disturbance.  Respiratory:  Positive for cough and shortness of breath.   Cardiovascular:  Negative for chest pain and leg swelling.  Gastrointestinal:  Negative for abdominal pain, diarrhea, nausea and vomiting.  Genitourinary:  Negative for difficulty urinating.  Musculoskeletal:  Negative for arthralgias and myalgias.  Skin:  Negative for rash.  Neurological:  Negative for headaches.  Psychiatric/Behavioral:  Negative for sleep disturbance.     Objective:  BP 132/67   Pulse 66   Temp 97.9 F (36.6 C)   Ht 5' 9.5 (1.765 m)   Wt 187 lb (84.8 kg)   SpO2 97%   BMI 27.22  kg/m   BP Readings from Last 3 Encounters:  03/02/24 132/67  09/02/23 114/64  03/04/23 129/68    Wt Readings from Last 3 Encounters:  03/02/24 187 lb (84.8 kg)  09/02/23 192 lb (87.1 kg)  05/14/23 192 lb (87.1 kg)     Physical Exam Physical Exam VITALS: BP- 132/67 GENERAL: Alert, cooperative, well developed, no acute distress. HEENT: Normocephalic, normal oropharynx, moist mucous membranes, nasal congestion present. CHEST: Clear to auscultation bilaterally, no wheezes, rhonchi, or crackles. CARDIOVASCULAR: Normal heart rate and rhythm, S1 and S2 normal without murmurs. ABDOMEN: Soft, non-tender, non-distended, without organomegaly, normal bowel  sounds. EXTREMITIES: No cyanosis or edema. NEUROLOGICAL: Cranial nerves grossly intact, moves all extremities without gross motor or sensory deficit.   Assessment & Plan:  Mixed hyperlipidemia -     CBC with Differential/Platelet -     CMP14+EGFR -     Lipid panel  Essential hypertension -     Lisinopril ; Take 1 tablet (20 mg total) by mouth daily.  Dispense: 90 tablet; Refill: 1 -     CBC with Differential/Platelet -     CMP14+EGFR  Vitamin D  deficiency -     CBC with Differential/Platelet -     CMP14+EGFR -     VITAMIN D  25 Hydroxy (Vit-D Deficiency, Fractures)  Subacute cough -     CBC with Differential/Platelet  Other orders -     Azithromycin ; Take two right away Then one a day for the next 4 days.  Dispense: 6 each; Refill: 0 -     predniSONE; Take 5 daily for 2 days followed by 4,3,2 and 1 for 2 days each.  Dispense: 30 tablet; Refill: 0    Assessment and Plan Assessment & Plan Subacute dry cough   He has a persistent dry cough with throat irritation, possibly related to lisinopril  use. There is no significant sputum production. Shortness of breath is present but not activity-limiting. The differential includes lisinopril -induced cough. Rechecked blood pressure before discharge. Will consider switching lisinopril  if the cough persists and cannot be managed otherwise.  Essential hypertension   Blood pressure was elevated today, possibly due to waiting in the car before the appointment. Lisinopril  has been used long-term, and previous readings have been better. Rechecked blood pressure before discharge. Continue current antihypertensive regimen unless changes are necessary due to the cough.  Mixed hyperlipidemia   Ordered routine blood work to monitor cholesterol levels.       Follow-up: Return in about 6 months (around 08/31/2024) for [facility] physical.  Butler Der, M.D.

## 2024-03-03 LAB — CBC WITH DIFFERENTIAL/PLATELET
Basophils Absolute: 0 x10E3/uL (ref 0.0–0.2)
Basos: 0 %
EOS (ABSOLUTE): 0.3 x10E3/uL (ref 0.0–0.4)
Eos: 4 %
Hematocrit: 46.3 % (ref 37.5–51.0)
Hemoglobin: 15.1 g/dL (ref 13.0–17.7)
Immature Grans (Abs): 0 x10E3/uL (ref 0.0–0.1)
Immature Granulocytes: 0 %
Lymphocytes Absolute: 0.9 x10E3/uL (ref 0.7–3.1)
Lymphs: 11 %
MCH: 30.3 pg (ref 26.6–33.0)
MCHC: 32.6 g/dL (ref 31.5–35.7)
MCV: 93 fL (ref 79–97)
Monocytes Absolute: 0.6 x10E3/uL (ref 0.1–0.9)
Monocytes: 8 %
Neutrophils Absolute: 6.5 x10E3/uL (ref 1.4–7.0)
Neutrophils: 77 %
Platelets: 153 x10E3/uL (ref 150–450)
RBC: 4.98 x10E6/uL (ref 4.14–5.80)
RDW: 13.2 % (ref 11.6–15.4)
WBC: 8.4 x10E3/uL (ref 3.4–10.8)

## 2024-03-03 LAB — CMP14+EGFR
ALT: 17 IU/L (ref 0–44)
AST: 20 IU/L (ref 0–40)
Albumin: 4 g/dL (ref 3.8–4.8)
Alkaline Phosphatase: 92 IU/L (ref 47–123)
BUN/Creatinine Ratio: 13 (ref 10–24)
BUN: 15 mg/dL (ref 8–27)
Bilirubin Total: 1 mg/dL (ref 0.0–1.2)
CO2: 24 mmol/L (ref 20–29)
Calcium: 8.8 mg/dL (ref 8.6–10.2)
Chloride: 107 mmol/L — ABNORMAL HIGH (ref 96–106)
Creatinine, Ser: 1.16 mg/dL (ref 0.76–1.27)
Globulin, Total: 2.2 g/dL (ref 1.5–4.5)
Glucose: 89 mg/dL (ref 70–99)
Potassium: 3.9 mmol/L (ref 3.5–5.2)
Sodium: 144 mmol/L (ref 134–144)
Total Protein: 6.2 g/dL (ref 6.0–8.5)
eGFR: 66 mL/min/1.73 (ref >59)

## 2024-03-03 LAB — LIPID PANEL
Chol/HDL Ratio: 2 ratio (ref 0.0–5.0)
Cholesterol, Total: 141 mg/dL (ref 100–199)
HDL: 69 mg/dL (ref >39)
LDL Chol Calc (NIH): 60 mg/dL (ref 0–99)
Triglycerides: 53 mg/dL (ref 0–149)
VLDL Cholesterol Cal: 12 mg/dL (ref 5–40)

## 2024-03-03 LAB — VITAMIN D 25 HYDROXY (VIT D DEFICIENCY, FRACTURES): Vit D, 25-Hydroxy: 45.2 ng/mL (ref 30.0–100.0)

## 2024-03-07 ENCOUNTER — Ambulatory Visit: Payer: Self-pay | Admitting: Family Medicine

## 2024-03-07 NOTE — Progress Notes (Signed)
Hello Maddax,  Your lab result is normal and/or stable.Some minor variations that are not significant are commonly marked abnormal, but do not represent any medical problem for you.  Best regards, Myron Stankovich, M.D.

## 2024-03-15 NOTE — Progress Notes (Signed)
 Pharmacy Quality Measure Review  This patient is appearing on a report for being at risk of failing the adherence measure for hypertension (ACEi/ARB) medications this calendar year.   Medication: lisinopril  20 mg  Last fill date: 03/02/24 for 90 day supply  Insurance report was not up to date. No action needed at this time.   Jenkins Graces, PharmD PGY1 Pharmacy Resident

## 2024-08-31 ENCOUNTER — Ambulatory Visit: Admitting: Family Medicine
